# Patient Record
Sex: Female | Born: 1970 | Race: White | Hispanic: No | Marital: Married | Smoking: Never smoker
Health system: Southern US, Community
[De-identification: ages and names within clinical notes are randomized; demographics above are authoritative.]

## PROBLEM LIST (undated history)

## (undated) DIAGNOSIS — K589 Irritable bowel syndrome without diarrhea: Secondary | ICD-10-CM

## (undated) DIAGNOSIS — K219 Gastro-esophageal reflux disease without esophagitis: Secondary | ICD-10-CM

## (undated) DIAGNOSIS — J342 Deviated nasal septum: Secondary | ICD-10-CM

## (undated) DIAGNOSIS — N870 Mild cervical dysplasia: Secondary | ICD-10-CM

## (undated) DIAGNOSIS — J302 Other seasonal allergic rhinitis: Secondary | ICD-10-CM

## (undated) DIAGNOSIS — G43909 Migraine, unspecified, not intractable, without status migrainosus: Secondary | ICD-10-CM

## (undated) DIAGNOSIS — N83209 Unspecified ovarian cyst, unspecified side: Secondary | ICD-10-CM

## (undated) HISTORY — DX: Irritable bowel syndrome, unspecified: K58.9

## (undated) HISTORY — PX: NO PAST SURGERIES: SHX2092

## (undated) HISTORY — DX: Gastro-esophageal reflux disease without esophagitis: K21.9

## (undated) HISTORY — DX: Other seasonal allergic rhinitis: J30.2

## (undated) HISTORY — DX: Migraine, unspecified, not intractable, without status migrainosus: G43.909

## (undated) HISTORY — DX: Mild cervical dysplasia: N87.0

## (undated) HISTORY — DX: Unspecified ovarian cyst, unspecified side: N83.209

## (undated) HISTORY — DX: Deviated nasal septum: J34.2

---

## 2005-06-24 ENCOUNTER — Other Ambulatory Visit: Admission: RE | Admit: 2005-06-24 | Discharge: 2005-06-24 | Payer: Self-pay | Admitting: Gynecology

## 2005-09-07 ENCOUNTER — Other Ambulatory Visit: Admission: RE | Admit: 2005-09-07 | Discharge: 2005-09-07 | Payer: Self-pay | Admitting: Gynecology

## 2006-03-13 ENCOUNTER — Inpatient Hospital Stay (HOSPITAL_COMMUNITY): Admission: AD | Admit: 2006-03-13 | Discharge: 2006-03-14 | Payer: Self-pay | Admitting: Gynecology

## 2006-04-26 ENCOUNTER — Other Ambulatory Visit: Admission: RE | Admit: 2006-04-26 | Discharge: 2006-04-26 | Payer: Self-pay | Admitting: Gynecology

## 2007-04-28 ENCOUNTER — Other Ambulatory Visit: Admission: RE | Admit: 2007-04-28 | Discharge: 2007-04-28 | Payer: Self-pay | Admitting: Gynecology

## 2008-05-17 ENCOUNTER — Other Ambulatory Visit: Admission: RE | Admit: 2008-05-17 | Discharge: 2008-05-17 | Payer: Self-pay | Admitting: Gynecology

## 2008-06-28 ENCOUNTER — Ambulatory Visit (HOSPITAL_COMMUNITY): Admission: RE | Admit: 2008-06-28 | Discharge: 2008-06-28 | Payer: Self-pay | Admitting: Gynecology

## 2009-01-28 ENCOUNTER — Ambulatory Visit: Payer: Self-pay | Admitting: Obstetrics and Gynecology

## 2009-02-03 ENCOUNTER — Ambulatory Visit: Payer: Self-pay | Admitting: Obstetrics and Gynecology

## 2009-02-07 ENCOUNTER — Ambulatory Visit: Payer: Self-pay | Admitting: Obstetrics and Gynecology

## 2009-02-12 ENCOUNTER — Ambulatory Visit: Payer: Self-pay | Admitting: Obstetrics and Gynecology

## 2009-06-23 ENCOUNTER — Encounter: Payer: Self-pay | Admitting: Obstetrics and Gynecology

## 2009-06-23 ENCOUNTER — Other Ambulatory Visit: Admission: RE | Admit: 2009-06-23 | Discharge: 2009-06-23 | Payer: Self-pay | Admitting: Obstetrics and Gynecology

## 2009-06-23 ENCOUNTER — Ambulatory Visit: Payer: Self-pay | Admitting: Obstetrics and Gynecology

## 2010-06-16 ENCOUNTER — Ambulatory Visit: Payer: Self-pay | Admitting: Women's Health

## 2010-06-25 ENCOUNTER — Other Ambulatory Visit: Admission: RE | Admit: 2010-06-25 | Discharge: 2010-06-25 | Payer: Self-pay | Admitting: Obstetrics and Gynecology

## 2010-06-25 ENCOUNTER — Ambulatory Visit: Payer: Self-pay | Admitting: Obstetrics and Gynecology

## 2011-04-19 ENCOUNTER — Other Ambulatory Visit: Payer: Self-pay | Admitting: Obstetrics and Gynecology

## 2011-04-19 DIAGNOSIS — Z1231 Encounter for screening mammogram for malignant neoplasm of breast: Secondary | ICD-10-CM

## 2011-05-03 ENCOUNTER — Ambulatory Visit (HOSPITAL_COMMUNITY)
Admission: RE | Admit: 2011-05-03 | Discharge: 2011-05-03 | Disposition: A | Discharge status: Home / Self Care | Payer: BC Managed Care – PPO | Source: Ambulatory Visit | Attending: Obstetrics and Gynecology | Admitting: Obstetrics and Gynecology

## 2011-05-03 DIAGNOSIS — Z1231 Encounter for screening mammogram for malignant neoplasm of breast: Secondary | ICD-10-CM

## 2011-06-30 ENCOUNTER — Other Ambulatory Visit (HOSPITAL_COMMUNITY)
Admission: RE | Admit: 2011-06-30 | Discharge: 2011-06-30 | Disposition: A | Discharge status: Home / Self Care | Payer: BC Managed Care – PPO | Source: Ambulatory Visit | Attending: Obstetrics and Gynecology | Admitting: Obstetrics and Gynecology

## 2011-06-30 ENCOUNTER — Other Ambulatory Visit: Payer: Self-pay | Admitting: Obstetrics and Gynecology

## 2011-06-30 ENCOUNTER — Encounter (INDEPENDENT_AMBULATORY_CARE_PROVIDER_SITE_OTHER): Payer: BC Managed Care – PPO | Admitting: Obstetrics and Gynecology

## 2011-06-30 DIAGNOSIS — Z01419 Encounter for gynecological examination (general) (routine) without abnormal findings: Secondary | ICD-10-CM

## 2011-06-30 DIAGNOSIS — Z13220 Encounter for screening for lipoid disorders: Secondary | ICD-10-CM

## 2011-06-30 DIAGNOSIS — Z124 Encounter for screening for malignant neoplasm of cervix: Secondary | ICD-10-CM | POA: Insufficient documentation

## 2012-04-17 ENCOUNTER — Other Ambulatory Visit (INDEPENDENT_AMBULATORY_CARE_PROVIDER_SITE_OTHER): Payer: Self-pay | Admitting: Otolaryngology

## 2012-04-17 DIAGNOSIS — J320 Chronic maxillary sinusitis: Secondary | ICD-10-CM

## 2012-04-19 ENCOUNTER — Ambulatory Visit
Admission: RE | Admit: 2012-04-19 | Discharge: 2012-04-19 | Disposition: A | Discharge status: Home / Self Care | Payer: BC Managed Care – PPO | Source: Ambulatory Visit | Attending: Otolaryngology | Admitting: Otolaryngology

## 2012-04-19 DIAGNOSIS — J320 Chronic maxillary sinusitis: Secondary | ICD-10-CM

## 2012-07-27 ENCOUNTER — Encounter: Payer: Self-pay | Admitting: Gynecology

## 2012-07-27 DIAGNOSIS — K589 Irritable bowel syndrome without diarrhea: Secondary | ICD-10-CM | POA: Insufficient documentation

## 2012-08-08 ENCOUNTER — Ambulatory Visit (INDEPENDENT_AMBULATORY_CARE_PROVIDER_SITE_OTHER): Payer: BC Managed Care – PPO | Admitting: Obstetrics and Gynecology

## 2012-08-08 ENCOUNTER — Encounter: Payer: Self-pay | Admitting: Obstetrics and Gynecology

## 2012-08-08 VITALS — BP 120/74 | Ht 64.0 in | Wt 120.0 lb

## 2012-08-08 DIAGNOSIS — G43909 Migraine, unspecified, not intractable, without status migrainosus: Secondary | ICD-10-CM | POA: Insufficient documentation

## 2012-08-08 DIAGNOSIS — N870 Mild cervical dysplasia: Secondary | ICD-10-CM | POA: Insufficient documentation

## 2012-08-08 DIAGNOSIS — Z01419 Encounter for gynecological examination (general) (routine) without abnormal findings: Secondary | ICD-10-CM

## 2012-08-08 DIAGNOSIS — N83209 Unspecified ovarian cyst, unspecified side: Secondary | ICD-10-CM | POA: Insufficient documentation

## 2012-08-08 DIAGNOSIS — J342 Deviated nasal septum: Secondary | ICD-10-CM | POA: Insufficient documentation

## 2012-08-08 LAB — CBC WITH DIFFERENTIAL/PLATELET
Lymphocytes Relative: 25 % (ref 12–46)
Lymphs Abs: 2.2 10*3/uL (ref 0.7–4.0)
MCV: 88.3 fL (ref 78.0–100.0)
Neutrophils Relative %: 65 % (ref 43–77)
Platelets: 340 10*3/uL (ref 150–400)
RBC: 4.63 MIL/uL (ref 3.87–5.11)
WBC: 8.5 10*3/uL (ref 4.0–10.5)

## 2012-08-08 NOTE — Patient Instructions (Signed)
Schedule mammogram.

## 2012-08-08 NOTE — Progress Notes (Signed)
Patient came to see me today for her annual GYN exam. Her husband has had a vasectomy. She has regular cycles. The first several days are heavy. She also gets significant dysmenorrhea that she takes ibuprofen for. We have previously discussed endometrial ablation but she does not feel it is significant enough to proceed yet. She had a normal mammogram last year. She is now due. She had a normal lipid profile last year. In 2006 she had a Pap smear showing CIN-1. She was scheduled for colposcopy but conceived in the interim. Colposcopy was not done. She had a normal Pap smear during her pregnancy. She has now had a total of 7 normal Pap smears since 2006 including a normal one in 2012. She has no other GYN problems.  Physical examination:Kim Julian Reil present. HEENT within normal limits. Neck: Thyroid not large. No masses. Supraclavicular nodes: not enlarged. Breasts: Examined in both sitting and lying  position. No skin changes and no masses. Abdomen: Soft no guarding rebound or masses or hernia. Pelvic: External: Within normal limits. BUS: Within normal limits. Vaginal:within normal limits. Good estrogen effect. No evidence of cystocele rectocele or enterocele. Cervix: clean. Uterus: Normal size and shape. Adnexa: No masses. Rectovaginal exam: Confirmatory and negative. Extremities: Within normal limits.  Assessment: #1. Dysmenorrhea and menorrhagia #2. CIN-1 in 2006  Plan: We  rediscussed endometrial ablation. She will let me know when necessary. She will schedule a mammogram.The new Pap smear guidelines were discussed with the patient. No pap  done.

## 2012-08-09 LAB — URINALYSIS W MICROSCOPIC + REFLEX CULTURE
Glucose, UA: NEGATIVE mg/dL
Leukocytes, UA: NEGATIVE
Protein, ur: NEGATIVE mg/dL
Urobilinogen, UA: 0.2 mg/dL (ref 0.0–1.0)

## 2013-06-18 ENCOUNTER — Other Ambulatory Visit: Payer: Self-pay | Admitting: Women's Health

## 2013-06-18 DIAGNOSIS — Z1231 Encounter for screening mammogram for malignant neoplasm of breast: Secondary | ICD-10-CM

## 2013-06-21 ENCOUNTER — Ambulatory Visit (HOSPITAL_COMMUNITY)
Admission: RE | Admit: 2013-06-21 | Discharge: 2013-06-21 | Disposition: A | Discharge status: Home / Self Care | Payer: 59 | Source: Ambulatory Visit | Attending: Women's Health | Admitting: Women's Health

## 2013-06-21 DIAGNOSIS — Z1231 Encounter for screening mammogram for malignant neoplasm of breast: Secondary | ICD-10-CM | POA: Insufficient documentation

## 2013-08-10 ENCOUNTER — Ambulatory Visit (INDEPENDENT_AMBULATORY_CARE_PROVIDER_SITE_OTHER): Payer: 59 | Admitting: Women's Health

## 2013-08-10 ENCOUNTER — Encounter: Payer: Self-pay | Admitting: Women's Health

## 2013-08-10 ENCOUNTER — Other Ambulatory Visit (HOSPITAL_COMMUNITY)
Admission: RE | Admit: 2013-08-10 | Discharge: 2013-08-10 | Disposition: A | Discharge status: Home / Self Care | Payer: 59 | Source: Ambulatory Visit | Attending: Gynecology | Admitting: Gynecology

## 2013-08-10 VITALS — BP 104/64 | Ht 64.0 in | Wt 121.0 lb

## 2013-08-10 DIAGNOSIS — Z01419 Encounter for gynecological examination (general) (routine) without abnormal findings: Secondary | ICD-10-CM

## 2013-08-10 LAB — CBC WITH DIFFERENTIAL/PLATELET
HCT: 40.5 % (ref 36.0–46.0)
Hemoglobin: 14.2 g/dL (ref 12.0–15.0)
Lymphocytes Relative: 19 % (ref 12–46)
Monocytes Absolute: 0.6 10*3/uL (ref 0.1–1.0)
Monocytes Relative: 8 % (ref 3–12)
Neutro Abs: 5.5 10*3/uL (ref 1.7–7.7)
WBC: 7.6 10*3/uL (ref 4.0–10.5)

## 2013-08-10 NOTE — Progress Notes (Signed)
Christina Hicks Sep 01, 1971 [id number]    History:    The patient presents for annual exam.  Monthly cycles/vasectomy. CIN-1 2006 with normal Paps after. Normal mammogram history breast noted to be dense. IBS well controlled with diet and exercise.   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 Father heart disease.   ROS:  A  ROS was performed and pertinent positives and negatives are included in the history.  Exam:  Filed Vitals:   08/10/13 0806  BP: 104/64    General appearance:  Normal Head/Neck:  Normal, without cervical or supraclavicular adenopathy. Thyroid:  Symmetrical, normal in size, without palpable masses or nodularity. Respiratory  Effort:  Normal  Auscultation:  Clear without wheezing or rhonchi Cardiovascular  Auscultation:  Regular rate, without rubs, murmurs or gallops  Edema/varicosities:  Not grossly evident Abdominal  Soft,nontender, without masses, guarding or rebound.  Liver/spleen:  No organomegaly noted  Hernia:  None appreciated  Skin  Inspection:  Grossly normal  Palpation:  Grossly normal Neurologic/psychiatric  Orientation:  Normal with appropriate conversation.  Mood/affect:  Normal  Genitourinary    Breasts: Examined lying and sitting.     Right: Without masses, retractions, discharge or axillary adenopathy.     Left: Without masses, retractions, discharge or axillary adenopathy.   Inguinal/mons:  Normal without inguinal adenopathy  External genitalia:  Normal  BUS/Urethra/Skene's glands:  Normal  Bladder:  Normal  Vagina:  Normal  Cervix:  Normal  Uterus:  normal in size, shape and contour.  Midline and mobile  Adnexa/parametria:     Rt: Without masses or tenderness.   Lt: Without masses or tenderness.  Anus and perineum: Normal  Digital rectal exam: Normal sphincter tone without  palpated masses or tenderness  Assessment/Plan:  42 y.o. M WF G3 P2 for annual exam.     CIN-1 2006 normal Paps after Vasectomy   Plan: SBE's, continue annual mammogram, 3-D tomography reviewed encouraged to 2 density of breast. Continue regular exercise, calcium rich diet, vitamin D 1000 daily encouraged. CBC, UA, Pap. 5 stitches removed from left great toe, well approximated, nonerythematous, injury one week ago, received a tetanus shot.  Harrington Challenger [facility], 8:29 AM 08/10/2013

## 2013-08-10 NOTE — Patient Instructions (Signed)

## 2013-08-10 NOTE — Addendum Note (Signed)
Addended by: Richardson Chiquito on: 08/10/2013 12:25 PM   Modules accepted: Orders

## 2013-08-11 LAB — URINALYSIS W MICROSCOPIC + REFLEX CULTURE
Bilirubin Urine: NEGATIVE
Crystals: NONE SEEN
Glucose, UA: NEGATIVE mg/dL
Specific Gravity, Urine: 1.009 (ref 1.005–1.030)
Squamous Epithelial / LPF: NONE SEEN
Urobilinogen, UA: 0.2 mg/dL (ref 0.0–1.0)

## 2013-08-14 ENCOUNTER — Encounter: Payer: Self-pay | Admitting: Obstetrics and Gynecology

## 2014-08-13 ENCOUNTER — Encounter: Payer: Self-pay | Admitting: Women's Health

## 2014-08-13 ENCOUNTER — Ambulatory Visit (INDEPENDENT_AMBULATORY_CARE_PROVIDER_SITE_OTHER): Payer: 59 | Admitting: Women's Health

## 2014-08-13 VITALS — BP 120/78 | Ht 64.5 in | Wt 122.0 lb

## 2014-08-13 DIAGNOSIS — Z13220 Encounter for screening for lipoid disorders: Secondary | ICD-10-CM

## 2014-08-13 DIAGNOSIS — E079 Disorder of thyroid, unspecified: Secondary | ICD-10-CM

## 2014-08-13 DIAGNOSIS — Z01419 Encounter for gynecological examination (general) (routine) without abnormal findings: Secondary | ICD-10-CM

## 2014-08-13 LAB — CBC WITH DIFFERENTIAL/PLATELET
BASOS PCT: 1 % (ref 0–1)
Basophils Absolute: 0.1 10*3/uL (ref 0.0–0.1)
EOS ABS: 0.1 10*3/uL (ref 0.0–0.7)
Eosinophils Relative: 1 % (ref 0–5)
HCT: 38.2 % (ref 36.0–46.0)
HEMOGLOBIN: 13 g/dL (ref 12.0–15.0)
Lymphocytes Relative: 36 % (ref 12–46)
Lymphs Abs: 3.4 10*3/uL (ref 0.7–4.0)
MCH: 30.5 pg (ref 26.0–34.0)
MCHC: 34 g/dL (ref 30.0–36.0)
MCV: 89.7 fL (ref 78.0–100.0)
Monocytes Absolute: 0.7 10*3/uL (ref 0.1–1.0)
Monocytes Relative: 7 % (ref 3–12)
NEUTROS ABS: 5.2 10*3/uL (ref 1.7–7.7)
NEUTROS PCT: 55 % (ref 43–77)
PLATELETS: 371 10*3/uL (ref 150–400)
RBC: 4.26 MIL/uL (ref 3.87–5.11)
RDW: 14.4 % (ref 11.5–15.5)
WBC: 9.5 10*3/uL (ref 4.0–10.5)

## 2014-08-13 LAB — LIPID PANEL
CHOL/HDL RATIO: 2.6 ratio
CHOLESTEROL: 151 mg/dL (ref 0–200)
HDL: 57 mg/dL (ref >39)
LDL Cholesterol: 81 mg/dL (ref 0–99)
TRIGLYCERIDES: 65 mg/dL (ref <150)
VLDL: 13 mg/dL (ref 0–40)

## 2014-08-13 NOTE — Progress Notes (Signed)
Christina Hicks September 10, 1971 [id number]    History:    Presents for annual exam.  Regular monthly 5 daycycle with menorrhagia and dysmenorrhea/vasectomy. Denies bleeding between cycles. 2006 LGSIL1 with normal Paps after. IBS. Normal mammogram history.  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  ROS:  A  12 point ROS was performed and pertinent positives and negatives are included.  Exam:  Filed Vitals:   08/13/14 1431  BP: 120/78    General appearance:  Normal Thyroid:  Symmetrical, normal in size, without palpable masses or nodularity. Respiratory  Auscultation:  Clear without wheezing or rhonchi Cardiovascular  Auscultation:  Regular rate, without rubs, murmurs or gallops  Edema/varicosities:  Not grossly evident Abdominal  Soft,nontender, without masses, guarding or rebound.  Liver/spleen:  No organomegaly noted  Hernia:  None appreciated  Skin  Inspection:  Grossly normal   Breasts: Examined lying and sitting.     Right: Without masses, retractions, discharge or axillary adenopathy.     Left: Without masses, retractions, discharge or axillary adenopathy. Gentitourinary   Inguinal/mons:  Normal without inguinal adenopathy  External genitalia:  Normal  BUS/Urethra/Skene's glands:  Normal  Vagina:  Normal  Cervix:  Normal  Uterus:   normal in size, shape and contour.  Midline and mobile  Adnexa/parametria:     Rt: Without masses or tenderness.   Lt: Without masses or tenderness.  Anus and perineum: Normal  Digital rectal exam: Normal sphincter tone without palpated masses or tenderness  Assessment/Plan:  43 y.o. MWF G2P2 for annual exam.    Monthly cycle with menorrhagia and dysmenorrhea/vasectomy 2006 CIN-1 with normal Paps after  Plan: Options reviewed, will try Motrin OTC with cycles.  Instructed to call if cycles become more difficult to manage. SBE's, annual mammogram, 3-D tomography reviewed and encouraged history of dense breasts. Encouraged regular exercise, calcium rich diet, vitamin D 1000 daily. CBC, TSH, lipid panel, UA, Pap normal 2014, new screening guidelines reviewed.   Note: This dictation was prepared with Dragon/digital dictation.  Any transcriptional errors that result are unintentional. Harrington Challenger [facility], 3:52 PM 08/13/2014

## 2014-08-13 NOTE — Patient Instructions (Signed)

## 2014-08-14 LAB — URINALYSIS W MICROSCOPIC + REFLEX CULTURE
BACTERIA UA: NONE SEEN
Bilirubin Urine: NEGATIVE
Casts: NONE SEEN
Crystals: NONE SEEN
Glucose, UA: NEGATIVE mg/dL
KETONES UR: NEGATIVE mg/dL
LEUKOCYTES UA: NEGATIVE
NITRITE: NEGATIVE
PROTEIN: NEGATIVE mg/dL
Specific Gravity, Urine: 1.015 (ref 1.005–1.030)
Squamous Epithelial / LPF: NONE SEEN
UROBILINOGEN UA: 0.2 mg/dL (ref 0.0–1.0)
pH: 6 (ref 5.0–8.0)

## 2014-08-14 LAB — TSH: TSH: 1.955 u[IU]/mL (ref 0.350–4.500)

## 2014-08-16 LAB — URINE CULTURE: Colony Count: 50000

## 2014-10-14 ENCOUNTER — Encounter: Payer: Self-pay | Admitting: Women's Health

## 2015-05-07 ENCOUNTER — Other Ambulatory Visit: Payer: Self-pay | Admitting: Women's Health

## 2015-05-07 DIAGNOSIS — Z1231 Encounter for screening mammogram for malignant neoplasm of breast: Secondary | ICD-10-CM

## 2015-05-21 ENCOUNTER — Ambulatory Visit (HOSPITAL_COMMUNITY)
Admission: RE | Admit: 2015-05-21 | Discharge: 2015-05-21 | Disposition: A | Discharge status: Home / Self Care | Payer: 59 | Source: Ambulatory Visit | Attending: Women's Health | Admitting: Women's Health

## 2015-05-21 DIAGNOSIS — Z1231 Encounter for screening mammogram for malignant neoplasm of breast: Secondary | ICD-10-CM

## 2015-09-18 ENCOUNTER — Encounter: Payer: Self-pay | Admitting: Women's Health

## 2015-09-18 ENCOUNTER — Other Ambulatory Visit (HOSPITAL_COMMUNITY)
Admission: RE | Admit: 2015-09-18 | Discharge: 2015-09-18 | Disposition: A | Discharge status: Home / Self Care | Payer: 59 | Source: Ambulatory Visit | Attending: Women's Health | Admitting: Women's Health

## 2015-09-18 ENCOUNTER — Ambulatory Visit (INDEPENDENT_AMBULATORY_CARE_PROVIDER_SITE_OTHER): Payer: 59 | Admitting: Women's Health

## 2015-09-18 VITALS — BP 120/76 | Ht 64.0 in | Wt 124.0 lb

## 2015-09-18 DIAGNOSIS — Z23 Encounter for immunization: Secondary | ICD-10-CM | POA: Diagnosis not present

## 2015-09-18 DIAGNOSIS — Z01419 Encounter for gynecological examination (general) (routine) without abnormal findings: Secondary | ICD-10-CM | POA: Diagnosis not present

## 2015-09-18 DIAGNOSIS — Z1151 Encounter for screening for human papillomavirus (HPV): Secondary | ICD-10-CM | POA: Insufficient documentation

## 2015-09-18 LAB — CBC WITH DIFFERENTIAL/PLATELET
BASOS PCT: 1 % (ref 0–1)
Basophils Absolute: 0.1 10*3/uL (ref 0.0–0.1)
EOS ABS: 0.1 10*3/uL (ref 0.0–0.7)
Eosinophils Relative: 1 % (ref 0–5)
HCT: 39.1 % (ref 36.0–46.0)
HEMOGLOBIN: 13.7 g/dL (ref 12.0–15.0)
Lymphocytes Relative: 27 % (ref 12–46)
Lymphs Abs: 2.2 10*3/uL (ref 0.7–4.0)
MCH: 31.6 pg (ref 26.0–34.0)
MCHC: 35 g/dL (ref 30.0–36.0)
MCV: 90.3 fL (ref 78.0–100.0)
MONO ABS: 0.5 10*3/uL (ref 0.1–1.0)
MPV: 9.6 fL (ref 8.6–12.4)
Monocytes Relative: 6 % (ref 3–12)
NEUTROS ABS: 5.3 10*3/uL (ref 1.7–7.7)
NEUTROS PCT: 65 % (ref 43–77)
PLATELETS: 343 10*3/uL (ref 150–400)
RBC: 4.33 MIL/uL (ref 3.87–5.11)
RDW: 13 % (ref 11.5–15.5)
WBC: 8.2 10*3/uL (ref 4.0–10.5)

## 2015-09-18 LAB — COMPREHENSIVE METABOLIC PANEL
ALBUMIN: 4.3 g/dL (ref 3.6–5.1)
ALT: 15 U/L (ref 6–29)
AST: 22 U/L (ref 10–30)
Alkaline Phosphatase: 46 U/L (ref 33–115)
BILIRUBIN TOTAL: 0.5 mg/dL (ref 0.2–1.2)
BUN: 14 mg/dL (ref 7–25)
CHLORIDE: 102 mmol/L (ref 98–110)
CO2: 27 mmol/L (ref 20–31)
CREATININE: 0.59 mg/dL (ref 0.50–1.10)
Calcium: 9.2 mg/dL (ref 8.6–10.2)
GLUCOSE: 84 mg/dL (ref 65–99)
Potassium: 4 mmol/L (ref 3.5–5.3)
SODIUM: 136 mmol/L (ref 135–146)
Total Protein: 7.3 g/dL (ref 6.1–8.1)

## 2015-09-18 LAB — LIPID PANEL
Cholesterol: 163 mg/dL (ref 125–200)
HDL: 53 mg/dL (ref >=46)
LDL CALC: 99 mg/dL (ref <130)
TRIGLYCERIDES: 55 mg/dL (ref <150)
Total CHOL/HDL Ratio: 3.1 Ratio (ref <=5.0)
VLDL: 11 mg/dL (ref <30)

## 2015-09-18 NOTE — Progress Notes (Signed)
Christina Hicks 19-Jan-1971 [id number] Hicks  History:    Presents for annual exam.  Regular monthly 5 day cycles/vasectomy. 2006 LGSIL with normal Paps after. IBS. Normal mammogram history. Has had some left breast tenderness in the past few weeks that is resolving.   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  ROS:  A ROS was performed and pertinent positives and negatives are included.  Exam:  Filed Vitals:   09/18/15 1058  BP: 120/76    General appearance:  Normal Thyroid:  Symmetrical, normal in size, without palpable masses or nodularity. Respiratory  Auscultation:  Clear without wheezing or rhonchi Cardiovascular  Auscultation:  Regular rate, without rubs, murmurs or gallops  Edema/varicosities:  Not grossly evident Abdominal  Soft,nontender, without masses, guarding or rebound.  Liver/spleen:  No organomegaly noted  Hernia:  None appreciated  Skin  Inspection:  Grossly normal   Breasts: Examined lying and sitting.     Right: Without masses, retractions, discharge or axillary adenopathy.     Left: Without masses, retractions, discharge or axillary adenopathy, no tenderness with exam. Gentitourinary   Inguinal/mons:  Normal without inguinal adenopathy  External genitalia:  Normal  BUS/Urethra/Skene's glands:  Normal  Vagina:  Normal  Cervix:  Normal  Uterus:   normal in size, shape and contour.  Midline and mobile  Adnexa/parametria:     Rt: Without masses or tenderness.   Lt: Without masses or tenderness.  Anus and perineum: Normal  Digital rectal exam: Normal sphincter tone without palpated masses or tenderness  Assessment/Plan:  44 y.o. MWF G2 P2 for annual exam.     Monthly cycle/vasectomy 2006 LGSIL with normal Paps after IBS Left breast tenderness resolving  Plan: SBE's, report any changes, call if  tenderness persists. Reviewed normality and nontender with exam.  Had a normal 3-D mammogram 05/2015. Continue 3-D history of dense breasts. Continue regular exercise, calcium rich diet, vitamin D 1000 daily encouraged. CBC, lipid panel, vitamin D, CMP, UA, Pap with HR HPV typing, new screening guidelines reviewed.  Flu shot given.  Harrington Challenger [facility], 11:56 AM 09/18/2015

## 2015-09-18 NOTE — Addendum Note (Signed)
Addended by: Dayna Barker on: 09/18/2015 12:13 PM   Modules accepted: Orders

## 2015-09-18 NOTE — Patient Instructions (Signed)
Health Maintenance, Female Adopting a healthy lifestyle and getting preventive care can go a long way to promote health and wellness. Talk with your health care provider about what schedule of regular examinations is right for you. This is a good chance for you to check in with your provider about disease prevention and staying healthy. In between checkups, there are plenty of things you can do on your own. Experts have done a lot of research about which lifestyle changes and preventive measures are most likely to keep you healthy. Ask your health care provider for more information. WEIGHT AND DIET  Eat a healthy diet  Be sure to include plenty of vegetables, fruits, low-fat dairy products, and lean protein.  Do not eat a lot of foods high in solid fats, added sugars, or salt.  Get regular exercise. This is one of the most important things you can do for your health.  Most adults should exercise for at least 150 minutes each week. The exercise should increase your heart rate and make you sweat (moderate-intensity exercise).  Most adults should also do strengthening exercises at least twice a week. This is in addition to the moderate-intensity exercise.  Maintain a healthy weight  Body mass index (BMI) is a measurement that can be used to identify possible weight problems. It estimates body fat based on height and weight. Your health care provider can help determine your BMI and help you achieve or maintain a healthy weight.  For females 20 years of age and older:   A BMI below 18.5 is considered underweight.  A BMI of 18.5 to 24.9 is normal.  A BMI of 25 to 29.9 is considered overweight.  A BMI of 30 and above is considered obese.  Watch levels of cholesterol and blood lipids  You should start having your blood tested for lipids and cholesterol at 44 years of age, then have this test every 5 years.  You may need to have your cholesterol levels checked more often if:  Your lipid  or cholesterol levels are high.  You are older than 44 years of age.  You are at high risk for heart disease.  CANCER SCREENING   Lung Cancer  Lung cancer screening is recommended for adults 55-80 years old who are at high risk for lung cancer because of a history of smoking.  A yearly low-dose CT scan of the lungs is recommended for people who:  Currently smoke.  Have quit within the past 15 years.  Have at least a 30-pack-year history of smoking. A pack year is smoking an average of one pack of cigarettes a day for 1 year.  Yearly screening should continue until it has been 15 years since you quit.  Yearly screening should stop if you develop a health problem that would prevent you from having lung cancer treatment.  Breast Cancer  Practice breast self-awareness. This means understanding how your breasts normally appear and feel.  It also means doing regular breast self-exams. Let your health care provider know about any changes, no matter how small.  If you are in your 20s or 30s, you should have a clinical breast exam (CBE) by a health care provider every 1-3 years as part of a regular health exam.  If you are 40 or older, have a CBE every year. Also consider having a breast X-ray (mammogram) every year.  If you have a family history of breast cancer, talk to your health care provider about genetic screening.  If you   are at high risk for breast cancer, talk to your health care provider about having an MRI and a mammogram every year.  Breast cancer gene (BRCA) assessment is recommended for women who have family members with BRCA-related cancers. BRCA-related cancers include:  Breast.  Ovarian.  Tubal.  Peritoneal cancers.  Results of the assessment will determine the need for genetic counseling and BRCA1 and BRCA2 testing. Cervical Cancer Your health care provider may recommend that you be screened regularly for cancer of the pelvic organs (ovaries, uterus, and  vagina). This screening involves a pelvic examination, including checking for microscopic changes to the surface of your cervix (Pap test). You may be encouraged to have this screening done every 3 years, beginning at age 21.  For women ages 30-65, health care providers may recommend pelvic exams and Pap testing every 3 years, or they may recommend the Pap and pelvic exam, combined with testing for human papilloma virus (HPV), every 5 years. Some types of HPV increase your risk of cervical cancer. Testing for HPV may also be done on women of any age with unclear Pap test results.  Other health care providers may not recommend any screening for nonpregnant women who are considered low risk for pelvic cancer and who do not have symptoms. Ask your health care provider if a screening pelvic exam is right for you.  If you have had past treatment for cervical cancer or a condition that could lead to cancer, you need Pap tests and screening for cancer for at least 20 years after your treatment. If Pap tests have been discontinued, your risk factors (such as having a new sexual partner) need to be reassessed to determine if screening should resume. Some women have medical problems that increase the chance of getting cervical cancer. In these cases, your health care provider may recommend more frequent screening and Pap tests. Colorectal Cancer  This type of cancer can be detected and often prevented.  Routine colorectal cancer screening usually begins at 44 years of age and continues through 44 years of age.  Your health care provider may recommend screening at an earlier age if you have risk factors for colon cancer.  Your health care provider may also recommend using home test kits to check for hidden blood in the stool.  A small camera at the end of a tube can be used to examine your colon directly (sigmoidoscopy or colonoscopy). This is done to check for the earliest forms of colorectal  cancer.  Routine screening usually begins at age 50.  Direct examination of the colon should be repeated every 5-10 years through 44 years of age. However, you may need to be screened more often if early forms of precancerous polyps or small growths are found. Skin Cancer  Check your skin from head to toe regularly.  Tell your health care provider about any new moles or changes in moles, especially if there is a change in a mole's shape or color.  Also tell your health care provider if you have a mole that is larger than the size of a pencil eraser.  Always use sunscreen. Apply sunscreen liberally and repeatedly throughout the day.  Protect yourself by wearing long sleeves, pants, a wide-brimmed hat, and sunglasses whenever you are outside. HEART DISEASE, DIABETES, AND HIGH BLOOD PRESSURE   High blood pressure causes heart disease and increases the risk of stroke. High blood pressure is more likely to develop in:  People who have blood pressure in the high end   of the normal range (130-139/85-89 mm Hg).  People who are overweight or obese.  People who are African American.  If you are 38-23 years of age, have your blood pressure checked every 3-5 years. If you are 61 years of age or older, have your blood pressure checked every year. You should have your blood pressure measured twice--once when you are at a hospital or clinic, and once when you are not at a hospital or clinic. Record the average of the two measurements. To check your blood pressure when you are not at a hospital or clinic, you can use:  An automated blood pressure machine at a pharmacy.  A home blood pressure monitor.  If you are between 45 years and 39 years old, ask your health care provider if you should take aspirin to prevent strokes.  Have regular diabetes screenings. This involves taking a blood sample to check your fasting blood sugar level.  If you are at a normal weight and have a low risk for diabetes,  have this test once every three years after 44 years of age.  If you are overweight and have a high risk for diabetes, consider being tested at a younger age or more often. PREVENTING INFECTION  Hepatitis B  If you have a higher risk for hepatitis B, you should be screened for this virus. You are considered at high risk for hepatitis B if:  You were born in a country where hepatitis B is common. Ask your health care provider which countries are considered high risk.  Your parents were born in a high-risk country, and you have not been immunized against hepatitis B (hepatitis B vaccine).  You have HIV or AIDS.  You use needles to inject street drugs.  You live with someone who has hepatitis B.  You have had sex with someone who has hepatitis B.  You get hemodialysis treatment.  You take certain medicines for conditions, including cancer, organ transplantation, and autoimmune conditions. Hepatitis C  Blood testing is recommended for:  Everyone born from [birth year] through 1965.  Anyone with known risk factors for hepatitis C. Sexually transmitted infections (STIs)  You should be screened for sexually transmitted infections (STIs) including gonorrhea and chlamydia if:  You are sexually active and are younger than 44 years of age.  You are older than 44 years of age and your health care provider tells you that you are at risk for this type of infection.  Your sexual activity has changed since you were last screened and you are at an increased risk for chlamydia or gonorrhea. Ask your health care provider if you are at risk.  If you do not have HIV, but are at risk, it may be recommended that you take a prescription medicine daily to prevent HIV infection. This is called pre-exposure prophylaxis (PrEP). You are considered at risk if:  You are sexually active and do not regularly use condoms or know the HIV status of your partner(s).  You take drugs by injection.  You are sexually  active with a partner who has HIV. Talk with your health care provider about whether you are at high risk of being infected with HIV. If you choose to begin PrEP, you should first be tested for HIV. You should then be tested every 3 months for as long as you are taking PrEP.  PREGNANCY   If you are premenopausal and you may become pregnant, ask your health care provider about preconception counseling.  If you may  become pregnant, take 400 to 800 micrograms (mcg) of folic acid every day.  If you want to prevent pregnancy, talk to your health care provider about birth control (contraception). OSTEOPOROSIS AND MENOPAUSE   Osteoporosis is a disease in which the bones lose minerals and strength with aging. This can result in serious bone fractures. Your risk for osteoporosis can be identified using a bone density scan.  If you are 61 years of age or older, or if you are at risk for osteoporosis and fractures, ask your health care provider if you should be screened.  Ask your health care provider whether you should take a calcium or vitamin D supplement to lower your risk for osteoporosis.  Menopause may have certain physical symptoms and risks.  Hormone replacement therapy may reduce some of these symptoms and risks. Talk to your health care provider about whether hormone replacement therapy is right for you.  HOME CARE INSTRUCTIONS   Schedule regular health, dental, and eye exams.  Stay current with your immunizations.   Do not use any tobacco products including cigarettes, chewing tobacco, or electronic cigarettes.  If you are pregnant, do not drink alcohol.  If you are breastfeeding, limit how much and how often you drink alcohol.  Limit alcohol intake to no more than 1 drink per day for nonpregnant women. One drink equals 12 ounces of beer, 5 ounces of wine, or 1 ounces of hard liquor.  Do not use street drugs.  Do not share needles.  Ask your health care provider for help if  you need support or information about quitting drugs.  Tell your health care provider if you often feel depressed.  Tell your health care provider if you have ever been abused or do not feel safe at home.   This information is not intended to replace advice given to you by your health care provider. Make sure you discuss any questions you have with your health care provider.   Document Released: 06/14/2011 Document Revised: 12/20/2014 Document Reviewed: 10/31/2013 Elsevier Interactive Patient Education [employer].

## 2015-09-19 LAB — VITAMIN D 25 HYDROXY (VIT D DEFICIENCY, FRACTURES): VIT D 25 HYDROXY: 43 ng/mL (ref 30–100)

## 2015-09-22 LAB — CYTOLOGY - PAP

## 2016-06-25 DIAGNOSIS — R300 Dysuria: Secondary | ICD-10-CM | POA: Diagnosis not present

## 2016-09-22 ENCOUNTER — Other Ambulatory Visit: Payer: Self-pay | Admitting: Women's Health

## 2016-09-22 DIAGNOSIS — Z1231 Encounter for screening mammogram for malignant neoplasm of breast: Secondary | ICD-10-CM

## 2016-10-05 ENCOUNTER — Ambulatory Visit
Admission: RE | Admit: 2016-10-05 | Discharge: 2016-10-05 | Disposition: A | Discharge status: Home / Self Care | Payer: BLUE CROSS/BLUE SHIELD | Source: Ambulatory Visit | Attending: Women's Health | Admitting: Women's Health

## 2016-10-05 DIAGNOSIS — Z1231 Encounter for screening mammogram for malignant neoplasm of breast: Secondary | ICD-10-CM

## 2016-10-20 DIAGNOSIS — H5213 Myopia, bilateral: Secondary | ICD-10-CM | POA: Diagnosis not present

## 2016-10-28 ENCOUNTER — Encounter: Payer: Self-pay | Admitting: Women's Health

## 2016-10-28 ENCOUNTER — Ambulatory Visit (INDEPENDENT_AMBULATORY_CARE_PROVIDER_SITE_OTHER): Payer: BLUE CROSS/BLUE SHIELD | Admitting: Women's Health

## 2016-10-28 VITALS — BP 130/82 | Ht 64.0 in | Wt 123.0 lb

## 2016-10-28 DIAGNOSIS — Z01419 Encounter for gynecological examination (general) (routine) without abnormal findings: Secondary | ICD-10-CM | POA: Diagnosis not present

## 2016-10-28 DIAGNOSIS — Z13220 Encounter for screening for lipoid disorders: Secondary | ICD-10-CM | POA: Diagnosis not present

## 2016-10-28 DIAGNOSIS — Z833 Family history of diabetes mellitus: Secondary | ICD-10-CM

## 2016-10-28 DIAGNOSIS — N946 Dysmenorrhea, unspecified: Secondary | ICD-10-CM

## 2016-10-28 MED ORDER — IBUPROFEN 800 MG PO TABS: 800.0000 mg | ORAL_TABLET | Freq: Three times a day (TID) | ORAL | 1 refills | Status: DC | PRN

## 2016-10-28 NOTE — Progress Notes (Signed)
Christina FileKasey T Froio 1971/05/05 [id number]    History:    Presents for annual exam. Regular monthly 5 day cycles/vasectomy. 2006 LGSIL with normal Paps after. IBS. Normal mammogram history. Continues to have painful cycles which she takes advil 800mg  which helps. Episodes of chest tightness. Denies SOB or palpitations. Saw eye doctor recently has questionable retinopathy. No family history of DM or GDM.    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 Father with heart disease  ROS:  A ROS was performed and pertinent positives and negatives are included.  Exam:  Vitals:   10/28/16 1027  BP: 130/82  Weight: 123 lb (55.8 kg)  Height: 5\' 4"  (1.626 m)   Body mass index is 21.11 kg/m.   General appearance:  Normal Thyroid:  Symmetrical, normal in size, without palpable masses or nodularity. Respiratory  Auscultation:  Clear without wheezing or rhonchi Cardiovascular  Auscultation:  Regular rate, without rubs, murmurs or gallops  Edema/varicosities:  Not grossly evident Abdominal  Soft,nontender, without masses, guarding or rebound.  Liver/spleen:  No organomegaly noted  Hernia:  None appreciated  Skin  Inspection:  Grossly normal   Breasts: Examined lying and sitting.     Right: Without masses, retractions, discharge or axillary adenopathy.     Left: Without masses, retractions, discharge or axillary adenopathy. Gentitourinary   Inguinal/mons:  Normal without inguinal adenopathy  External genitalia:  Normal  BUS/Urethra/Skene's glands:  Normal  Vagina:  Normal  Cervix:  Normal  Uterus: normal in size, shape and contour.  Midline and mobile  Adnexa/parametria:     Rt: Without masses or tenderness.   Lt: Without masses or tenderness.  Anus and perineum: Normal  Digital rectal exam: Normal sphincter tone without palpated masses  or tenderness  Assessment/Plan:  45 y.o. MWF G3P2 for annual exam.  Monthly cycle/Dysmenorrhea/vasectomy 2006 LGSIL with normal Paps after IBS Retinopathy - ophthalmologist managing  Plan: Motrin 800 mg every 8 hours when necessary with cycles prescription given. SBE's, continue 3-D mammograms, regular exercise, calcium rich diet, vitamin D. CBC, lipid panel, CMP, UA. Pap normal with negative HR HPV 2016, new screening guidelines reviewed. Reviewed sonohystogram and ablation if cycles continue to cause distress. Continue follow up with opthalmologist for retinopathy. Report to ER if worsening chest pain.    Harrington ChallengerYOUNG,NANC[facility]NP, 11:36 AM 10/28/2016

## 2016-10-28 NOTE — Patient Instructions (Signed)

## 2016-10-29 LAB — URINALYSIS W MICROSCOPIC + REFLEX CULTURE
BACTERIA UA: NONE SEEN [HPF]
BILIRUBIN URINE: NEGATIVE
Casts: NONE SEEN [LPF]
Crystals: NONE SEEN [HPF]
GLUCOSE, UA: NEGATIVE
Ketones, ur: NEGATIVE
LEUKOCYTES UA: NEGATIVE
NITRITE: NEGATIVE
PH: 6 (ref 5.0–8.0)
PROTEIN: NEGATIVE
RBC / HPF: NONE SEEN RBC/HPF (ref <=2)
SQUAMOUS EPITHELIAL / LPF: NONE SEEN [HPF] (ref <=5)
Specific Gravity, Urine: 1.006 (ref 1.001–1.035)
WBC UA: NONE SEEN WBC/HPF (ref <=5)
Yeast: NONE SEEN [HPF]

## 2016-11-08 ENCOUNTER — Other Ambulatory Visit: Payer: BLUE CROSS/BLUE SHIELD

## 2016-11-08 DIAGNOSIS — Z01419 Encounter for gynecological examination (general) (routine) without abnormal findings: Secondary | ICD-10-CM | POA: Diagnosis not present

## 2016-11-08 DIAGNOSIS — Z833 Family history of diabetes mellitus: Secondary | ICD-10-CM | POA: Diagnosis not present

## 2016-11-08 DIAGNOSIS — Z13220 Encounter for screening for lipoid disorders: Secondary | ICD-10-CM | POA: Diagnosis not present

## 2016-11-09 LAB — COMPREHENSIVE METABOLIC PANEL
ALT: 13 U/L (ref 6–29)
AST: 21 U/L (ref 10–35)
Albumin: 4.2 g/dL (ref 3.6–5.1)
Alkaline Phosphatase: 47 U/L (ref 33–115)
BILIRUBIN TOTAL: 0.4 mg/dL (ref 0.2–1.2)
BUN: 11 mg/dL (ref 7–25)
CHLORIDE: 103 mmol/L (ref 98–110)
CO2: 28 mmol/L (ref 20–31)
CREATININE: 0.66 mg/dL (ref 0.50–1.10)
Calcium: 9.1 mg/dL (ref 8.6–10.2)
Glucose, Bld: 89 mg/dL (ref 65–99)
Potassium: 4.3 mmol/L (ref 3.5–5.3)
SODIUM: 137 mmol/L (ref 135–146)
TOTAL PROTEIN: 6.9 g/dL (ref 6.1–8.1)

## 2016-11-09 LAB — HEMOGLOBIN A1C
Hgb A1c MFr Bld: 5.1 % (ref <5.7)
MEAN PLASMA GLUCOSE: 100 mg/dL

## 2016-11-09 LAB — LIPID PANEL
CHOLESTEROL: 163 mg/dL (ref <200)
HDL: 71 mg/dL (ref >50)
LDL CALC: 83 mg/dL (ref <100)
Total CHOL/HDL Ratio: 2.3 Ratio (ref <5.0)
Triglycerides: 44 mg/dL (ref <150)
VLDL: 9 mg/dL (ref <30)

## 2016-11-09 LAB — CBC WITH DIFFERENTIAL/PLATELET
BASOS ABS: 0 {cells}/uL (ref 0–200)
Basophils Relative: 0 %
EOS PCT: 1 %
Eosinophils Absolute: 72 cells/uL (ref 15–500)
HCT: 40.5 % (ref 35.0–45.0)
HEMOGLOBIN: 13.4 g/dL (ref 11.7–15.5)
LYMPHS ABS: 1872 {cells}/uL (ref 850–3900)
Lymphocytes Relative: 26 %
MCH: 30.2 pg (ref 27.0–33.0)
MCHC: 33.1 g/dL (ref 32.0–36.0)
MCV: 91.2 fL (ref 80.0–100.0)
MONOS PCT: 5 %
MPV: 9.3 fL (ref 7.5–12.5)
Monocytes Absolute: 360 cells/uL (ref 200–950)
NEUTROS ABS: 4896 {cells}/uL (ref 1500–7800)
Neutrophils Relative %: 68 %
PLATELETS: 343 10*3/uL (ref 140–400)
RBC: 4.44 MIL/uL (ref 3.80–5.10)
RDW: 13.1 % (ref 11.0–15.0)
WBC: 7.2 10*3/uL (ref 3.8–10.8)

## 2017-01-24 DIAGNOSIS — H43313 Vitreous membranes and strands, bilateral: Secondary | ICD-10-CM | POA: Diagnosis not present

## 2017-04-23 ENCOUNTER — Emergency Department (HOSPITAL_BASED_OUTPATIENT_CLINIC_OR_DEPARTMENT_OTHER)
Admission: EM | Admit: 2017-04-23 | Discharge: 2017-04-23 | Disposition: A | Discharge status: Home / Self Care | Payer: BLUE CROSS/BLUE SHIELD | Attending: Emergency Medicine | Admitting: Emergency Medicine

## 2017-04-23 ENCOUNTER — Encounter (HOSPITAL_BASED_OUTPATIENT_CLINIC_OR_DEPARTMENT_OTHER): Payer: Self-pay | Admitting: Emergency Medicine

## 2017-04-23 ENCOUNTER — Emergency Department (HOSPITAL_BASED_OUTPATIENT_CLINIC_OR_DEPARTMENT_OTHER): Payer: BLUE CROSS/BLUE SHIELD

## 2017-04-23 DIAGNOSIS — Y929 Unspecified place or not applicable: Secondary | ICD-10-CM | POA: Insufficient documentation

## 2017-04-23 DIAGNOSIS — W19XXXA Unspecified fall, initial encounter: Secondary | ICD-10-CM

## 2017-04-23 DIAGNOSIS — S0181XA Laceration without foreign body of other part of head, initial encounter: Secondary | ICD-10-CM

## 2017-04-23 DIAGNOSIS — S80212A Abrasion, left knee, initial encounter: Secondary | ICD-10-CM | POA: Insufficient documentation

## 2017-04-23 DIAGNOSIS — Y999 Unspecified external cause status: Secondary | ICD-10-CM | POA: Insufficient documentation

## 2017-04-23 DIAGNOSIS — S01111A Laceration without foreign body of right eyelid and periocular area, initial encounter: Secondary | ICD-10-CM | POA: Diagnosis not present

## 2017-04-23 DIAGNOSIS — S80211A Abrasion, right knee, initial encounter: Secondary | ICD-10-CM | POA: Diagnosis not present

## 2017-04-23 DIAGNOSIS — W01198A Fall on same level from slipping, tripping and stumbling with subsequent striking against other object, initial encounter: Secondary | ICD-10-CM | POA: Diagnosis not present

## 2017-04-23 DIAGNOSIS — Y939 Activity, unspecified: Secondary | ICD-10-CM | POA: Diagnosis not present

## 2017-04-23 DIAGNOSIS — S6991XA Unspecified injury of right wrist, hand and finger(s), initial encounter: Secondary | ICD-10-CM | POA: Diagnosis not present

## 2017-04-23 DIAGNOSIS — S40211A Abrasion of right shoulder, initial encounter: Secondary | ICD-10-CM | POA: Diagnosis not present

## 2017-04-23 DIAGNOSIS — S0990XA Unspecified injury of head, initial encounter: Secondary | ICD-10-CM | POA: Diagnosis not present

## 2017-04-23 MED ORDER — LIDOCAINE-EPINEPHRINE 2 %-1:100000 IJ SOLN
20.0000 mL | Freq: Once | INTRAMUSCULAR | Status: DC
  Filled 2017-04-23: qty 1

## 2017-04-23 MED ORDER — ACETAMINOPHEN 325 MG PO TABS
650.0000 mg | ORAL_TABLET | Freq: Once | ORAL | Status: AC
  Administered 2017-04-23: 650 mg via ORAL
  Filled 2017-04-23: qty 2

## 2017-04-23 NOTE — ED Notes (Signed)
pateint states that she was running and tripped over a piece of abnormal cement. The patient has lacerations noted with bleeding controlled to her right eye, under her eye and eye brow. Patient also has swelling and bruising to her right periorbital area. Noted abrasions to her left knee.

## 2017-04-23 NOTE — ED Provider Notes (Signed)
This patient was seen and evaluated by Dr. Dalene SeltzerSchlossman.  I performed the laceration repair with local anesthetic.  Please see Dr. Burr MedicoSchlossman's note for full details.   LACERATION REPAIR Performed by: Christina Hicks Authorized by: Christina Hicks Consent: Verbal consent obtained. Risks and benefits: risks, benefits and alternatives were discussed.  Risks included but not limited to poor wound healing, infection, pain, scar, poor cosmetic result, wound dehiscence, nerve damage Consent given by: patient Patient identity confirmed: provided demographic data, with patient Prepped and Draped in normal sterile fashion Wound explored and probed through full length.  Laceration Location: right eyebrow  Laceration Length: 3.5cm.  No Foreign Bodies seen or palpated  Anesthesia: local infiltration  Local anesthetic: lidocaine 2% with epinephrine was injected through the wound into surrounding tissues.   Anesthetic total: 5 ml  Irrigation method: syringe Amount of cleaning: standard, irrigated with over 500ml sterile saline, chloraprep used on surrounding skin prior to injection.   Skin closure: 6.0 fast gut, well aligned.   Number of sutures: 9  Technique: Simple interrupted   Patient tolerance: Patient tolerated the procedure well with no immediate complications.  Patient EOM remained pain free, unimpaired after suturing.      Christina Hicks, Christina Dehaan Hicks, Christina Hicks 04/23/17 1749    Christina Hicks, Christina Amaker Hicks, Christina Hicks 04/25/17 1015    Alvira MondaySchlossman, Erin, MD 04/27/17 [phone]

## 2017-04-23 NOTE — ED Provider Notes (Signed)
[facility]   CSN: [id number] Arrival date & time: 04/23/17  0858     History   Chief Complaint Chief Complaint  Patient presents with  . Fall    HPI Christina Hicks is a 46 y.o. female.  HPI   Running this morning and tripped over uneven area of concrete and fell around 815AM.   Hit knees hands, shoulder then face, right side of face. 4/10 pain to area.  Knee feels stiff, hands sore, mild. Shoulder with abrasion but feels fine. Neck and shoulder improved. Up to date on tetanus.  Some nausea on the way in. No change in vision.   Past Medical History:  Diagnosis Date  . CIN I (cervical intraepithelial neoplasia I)   . Deviated septum   . IBS (irritable bowel syndrome)   . Migraine   . Ovarian cyst     Patient Active Problem List   Diagnosis Date Noted  . Deviated septum   . Migraine   . CIN I (cervical intraepithelial neoplasia I)   . Ovarian cyst   . IBS (irritable bowel syndrome)     History reviewed. No pertinent surgical history.  OB History    Gravida Para Term Preterm AB Living   3 2 2   1 2    SAB TAB Ectopic Multiple Live Births                   Home Medications    Prior to Admission medications   Medication Sig Start Date End Date Taking? Authorizing Provider  acetaminophen (TYLENOL) 500 MG tablet Take 500 mg by mouth every 6 (six) hours as needed.    [provider]  Calcium Carbonate Antacid (TUMS E-X PO) Take by mouth.    [provider]  Ibuprofen (ADVIL PO) Take by mouth.    [provider]  ibuprofen (ADVIL,MOTRIN) 800 MG tablet Take 1 tablet (800 mg total) by mouth every 8 (eight) hours as needed. 10/28/16   Harrington Challenger, NP  Triamcinolone Acetonide (NASACORT AQ NA) Place into the nose.    [provider]    Family History Family History  Problem Relation Age of Onset  . Hypertension Mother   . Heart disease Father     Social History Social History  Substance Use  Topics  . Smoking status: Never Smoker  . Smokeless tobacco: Never Used  . Alcohol use 0.0 oz/week     Comment: Rare     Allergies   Azithromycin and Sulfa antibiotics   Review of Systems Review of Systems  Constitutional: Negative for fever.  HENT: Negative for sore throat.   Eyes: Negative for visual disturbance.  Respiratory: Negative for cough and shortness of breath.   Cardiovascular: Negative for chest pain.  Gastrointestinal: Negative for abdominal pain.  Genitourinary: Negative for difficulty urinating.  Musculoskeletal: Negative for back pain and neck pain.  Skin: Positive for wound. Negative for rash.  Neurological: Positive for headaches. Negative for syncope.     Physical Exam Updated Vital Signs BP (!) 134/97 (BP Location: Right Arm)   Pulse 64   Temp 98.5 F (36.9 C) (Oral)   Resp 18   Ht 5\' 4"  (1.626 m)   Wt 122 lb (55.3 kg)   LMP 03/25/2017   SpO2 100%   BMI 20.94 kg/m   Physical Exam  Constitutional: She is oriented to person, place, and time. She appears well-developed and well-nourished. No distress.  HENT:  Head: Normocephalic.  Right supraorbital tenderness, contusion, laceration through eyebrow approx 4cm, laceration inferior 2cm  Superficial laceration 1cm inferior to right eye  Eyes: Conjunctivae and EOM are normal. Pupils are equal, round, and reactive to light.  Neck: Normal range of motion.  Cardiovascular: Normal rate, regular rhythm, normal heart sounds and intact distal pulses.  Exam reveals no gallop and no friction rub.   No murmur heard. Pulmonary/Chest: Effort normal and breath sounds normal. No respiratory distress. She has no wheezes. She has no rales.  Abdominal: Soft. She exhibits no distension. There is no tenderness. There is no guarding.  Musculoskeletal: She exhibits no edema.       Right wrist: She exhibits no bony tenderness.       Right knee: She exhibits normal range of motion, no swelling and no effusion. No  tenderness found.       Left knee: She exhibits normal range of motion, no swelling and no deformity. Tenderness found.       Cervical back: She exhibits no tenderness and no bony tenderness.       Thoracic back: She exhibits no bony tenderness.       Lumbar back: She exhibits no bony tenderness.       Right hand: She exhibits tenderness (ulnar side of hand).  Neurological: She is alert and oriented to person, place, and time.  Skin: Skin is warm and dry. Abrasion (right shoulder, left knee, right knee) noted. No rash noted. She is not diaphoretic. No erythema.  Nursing note and vitals reviewed.    ED Treatments / Results  Labs (all labs ordered are listed, but only abnormal results are displayed) Labs Reviewed - No data to display  EKG  EKG Interpretation None       Radiology Ct Head Wo Contrast  Result Date: 04/23/2017 CLINICAL DATA:  Fall running, right eyebrow laceration, left knee abrasions. EXAM: CT HEAD WITHOUT CONTRAST CT MAXILLOFACIAL WITHOUT CONTRAST TECHNIQUE: Multidetector CT imaging of the head and maxillofacial structures were performed using the standard protocol without intravenous contrast. Multiplanar CT image reconstructions of the maxillofacial structures were also generated. COMPARISON:  None. FINDINGS: CT HEAD FINDINGS Brain: No evidence of an acute infarct, acute hemorrhage, mass lesion, mass effect or hydrocephalus. Vascular: No hyperdense vessel or unexpected calcification. Skull: No fracture.  Right rib nasal septum deviation. Other: Clear paranasal sinuses and mastoid air cells. CT MAXILLOFACIAL FINDINGS Osseous: No fracture.  Right rete nasal septum deviation. Orbits: Negative. No traumatic or inflammatory finding. Sinuses: Clear. Soft tissues: Right facial soft tissue swelling. IMPRESSION: 1. No evidence of acute intracranial trauma or facial fracture. 2. Right facial soft tissue swelling. 3. Rightward nasal septum deviation. Electronically Signed   By: Leanna BattlesMelinda   Blietz M.D.   On: 04/23/2017 10:40   Dg Knee Complete 4 Views Left  Result Date: 04/23/2017 CLINICAL DATA:  Pt fell today while running, states she tripped on an raised piece of cement falling forward, laceration to right eyebrow, abrasions to left knee and right hand Left knee stiffness Denies chance of pregnancy EXAM: LEFT KNEE - COMPLETE 4+ VIEW COMPARISON:  None. FINDINGS: Osseous alignment is normal. Bone mineralization is normal. No fracture line or displaced fracture fragment seen. No convincing joint effusion and overlying soft tissues are unremarkable. IMPRESSION: Negative. Electronically Signed   By: Bary RichardStan  Maynard M.D.   On: 04/23/2017 10:38   Dg Hand Complete Right  Result Date: 04/23/2017 CLINICAL DATA:  Pt fell today while running, states she tripped on an raised piece of  cement falling forward, laceration to right eyebrow, abrasions to left knee and right hand EXAM: RIGHT HAND - COMPLETE 3+ VIEW COMPARISON:  None. FINDINGS: Osseous alignment is normal. No fracture line or displaced fracture fragment. Adjacent soft tissues are unremarkable. IMPRESSION: Negative. Electronically Signed   By: Bary Richard M.D.   On: 04/23/2017 10:37   Ct Maxillofacial Wo Contrast  Result Date: 04/23/2017 CLINICAL DATA:  Fall running, right eyebrow laceration, left knee abrasions. EXAM: CT HEAD WITHOUT CONTRAST CT MAXILLOFACIAL WITHOUT CONTRAST TECHNIQUE: Multidetector CT imaging of the head and maxillofacial structures were performed using the standard protocol without intravenous contrast. Multiplanar CT image reconstructions of the maxillofacial structures were also generated. COMPARISON:  None. FINDINGS: CT HEAD FINDINGS Brain: No evidence of an acute infarct, acute hemorrhage, mass lesion, mass effect or hydrocephalus. Vascular: No hyperdense vessel or unexpected calcification. Skull: No fracture.  Right rib nasal septum deviation. Other: Clear paranasal sinuses and mastoid air cells. CT MAXILLOFACIAL  FINDINGS Osseous: No fracture.  Right rete nasal septum deviation. Orbits: Negative. No traumatic or inflammatory finding. Sinuses: Clear. Soft tissues: Right facial soft tissue swelling. IMPRESSION: 1. No evidence of acute intracranial trauma or facial fracture. 2. Right facial soft tissue swelling. 3. Rightward nasal septum deviation. Electronically Signed   By: Leanna Battles M.D.   On: 04/23/2017 10:40    Procedures Procedures (including critical care time)  Medications Ordered in ED Medications  acetaminophen (TYLENOL) tablet 650 mg (650 mg Oral Given 04/23/17 1008)     Initial Impression / Assessment and Plan / ED Course  I have reviewed the triage vital signs and the nursing notes.  Pertinent labs & imaging results that were available during my care of the patient were reviewed by me and considered in my medical decision making (see chart for details).    46yo female presents with concern for fall while running with facial pain, laceration to her face, knee pain, left hand pain.  XR negative. No snuff box tenderness. CT head/face shows no sign of fracture or bleed. Absorbable sutures placed by Merceda Elks.  Patient discharged in stable condition with understanding of reasons to return.    Final Clinical Impressions(s) / ED Diagnoses   Final diagnoses:  Fall, initial encounter  Facial laceration, initial encounter    New Prescriptions Discharge Medication List as of 04/23/2017 12:57 PM       Alvira Monday, MD 04/24/17 2241

## 2017-04-27 ENCOUNTER — Encounter: Payer: Self-pay | Admitting: Gynecology

## 2017-10-17 ENCOUNTER — Other Ambulatory Visit: Payer: Self-pay | Admitting: Women's Health

## 2017-10-17 DIAGNOSIS — Z1231 Encounter for screening mammogram for malignant neoplasm of breast: Secondary | ICD-10-CM

## 2017-10-31 ENCOUNTER — Ambulatory Visit
Admission: RE | Admit: 2017-10-31 | Discharge: 2017-10-31 | Disposition: A | Discharge status: Home / Self Care | Payer: BLUE CROSS/BLUE SHIELD | Source: Ambulatory Visit | Attending: Women's Health | Admitting: Women's Health

## 2017-10-31 DIAGNOSIS — Z1231 Encounter for screening mammogram for malignant neoplasm of breast: Secondary | ICD-10-CM

## 2017-11-01 ENCOUNTER — Encounter: Payer: Self-pay | Admitting: Women's Health

## 2017-11-23 ENCOUNTER — Encounter: Payer: BLUE CROSS/BLUE SHIELD | Admitting: Women's Health

## 2017-12-26 ENCOUNTER — Ambulatory Visit (INDEPENDENT_AMBULATORY_CARE_PROVIDER_SITE_OTHER): Payer: BLUE CROSS/BLUE SHIELD | Admitting: Women's Health

## 2017-12-26 ENCOUNTER — Encounter: Payer: Self-pay | Admitting: Women's Health

## 2017-12-26 VITALS — BP 130/80 | Ht 64.0 in | Wt 125.0 lb

## 2017-12-26 DIAGNOSIS — N946 Dysmenorrhea, unspecified: Secondary | ICD-10-CM | POA: Diagnosis not present

## 2017-12-26 DIAGNOSIS — Z01419 Encounter for gynecological examination (general) (routine) without abnormal findings: Secondary | ICD-10-CM | POA: Diagnosis not present

## 2017-12-26 MED ORDER — AMOXICILLIN 500 MG PO CAPS: 500.0000 mg | ORAL_CAPSULE | Freq: Three times a day (TID) | ORAL | 0 refills | Status: DC

## 2017-12-26 MED ORDER — IBUPROFEN 800 MG PO TABS: 800.0000 mg | ORAL_TABLET | Freq: Three times a day (TID) | ORAL | 1 refills | Status: AC | PRN

## 2017-12-26 NOTE — Progress Notes (Signed)
l °

## 2017-12-26 NOTE — Progress Notes (Signed)
Christina FileKasey T Cabler [birth year] [id number]    History:    Presents for annual exam.  Monthly 5 day cycle/vasectomy. Dysmenorrhea using Motrin as needed. Normal mammogram history. 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 History of IBS and migraines. Complaining of sinus pressure, pain in teeth/ jaw area fatigue, and generally not feeling well for the past week, has been using over-the-counter products with minimal relief. History of sinus infections.  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  Exam:  Vitals:   12/26/17 1553  BP: 130/80  Weight: 125 lb (56.7 kg)  Height: 5\' 4"  (1.626 m)   Body mass index is 21.46 kg/m.   General appearance:  Normal Thyroid:  Symmetrical, normal in size, without palpable masses or nodularity. Respiratory  Auscultation:  Clear without wheezing or rhonchi Cardiovascular  Auscultation:  Regular rate, without rubs, murmurs or gallops  Edema/varicosities:  Not grossly evident Abdominal  Soft,nontender, without masses, guarding or rebound.  Liver/spleen:  No organomegaly noted  Hernia:  None appreciated  Skin  Inspection:  Grossly normal   Breasts: Examined lying and sitting.     Right: Without masses, retractions, discharge or axillary adenopathy.     Left: Without masses, retractions, discharge or axillary adenopathy. Gentitourinary   Inguinal/mons:  Normal without inguinal adenopathy  External genitalia:  Normal  BUS/Urethra/Skene's glands:  Normal  Vagina:  Normal  Cervix:  Normal  Uterus:   normal in size, shape and contour.  Midline and mobile  Adnexa/parametria:     Rt: Without masses or tenderness.   Lt: Without masses or tenderness.  Anus and perineum: Normal  Digital rectal exam: Normal sphincter  tone without palpated masses or tenderness  Assessment/Plan:  47 y.o. MWF G3 P2 for annual exam.  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  Plan: Sinus infection reviewed, will continue increased rest, over-the-counter products. If symptoms persist/worsen Amoxil 500 mg 3 times daily for 5 days, prescription given. SBE's, continue annual screening mammogram, calcium rich diet, vitamin D 1000 daily encouraged. Continue active lifestyle with regular exercise and running. CBC, CMP. We'll check lipid panel next year instructed to come fasting. Pap with HR HPV typing.  Harrington Challengerancy J [facility]WHNP, 4:43 PM 12/26/2017

## 2017-12-26 NOTE — Patient Instructions (Signed)
Health Maintenance, Female Adopting a healthy lifestyle and getting preventive care can go a long way to promote health and wellness. Talk with your health care provider about what schedule of regular examinations is right for you. This is a good chance for you to check in with your provider about disease prevention and staying healthy. In between checkups, there are plenty of things you can do on your own. Experts have done a lot of research about which lifestyle changes and preventive measures are most likely to keep you healthy. Ask your health care provider for more information. Weight and diet Eat a healthy diet  Be sure to include plenty of vegetables, fruits, low-fat dairy products, and lean protein.  Do not eat a lot of foods high in solid fats, added sugars, or salt.  Get regular exercise. This is one of the most important things you can do for your health. ? Most adults should exercise for at least 150 minutes each week. The exercise should increase your heart rate and make you sweat (moderate-intensity exercise). ? Most adults should also do strengthening exercises at least twice a week. This is in addition to the moderate-intensity exercise.  Maintain a healthy weight  Body mass index (BMI) is a measurement that can be used to identify possible weight problems. It estimates body fat based on height and weight. Your health care provider can help determine your BMI and help you achieve or maintain a healthy weight.  For females 20 years of age and older: ? A BMI below 18.5 is considered underweight. ? A BMI of 18.5 to 24.9 is normal. ? A BMI of 25 to 29.9 is considered overweight. ? A BMI of 30 and above is considered obese.  Watch levels of cholesterol and blood lipids  You should start having your blood tested for lipids and cholesterol at 47 years of age, then have this test every 5 years.  You may need to have your cholesterol levels checked more often if: ? Your lipid or  cholesterol levels are high. ? You are older than 47 years of age. ? You are at high risk for heart disease.  Cancer screening Lung Cancer  Lung cancer screening is recommended for adults 55-80 years old who are at high risk for lung cancer because of a history of smoking.  A yearly low-dose CT scan of the lungs is recommended for people who: ? Currently smoke. ? Have quit within the past 15 years. ? Have at least a 30-pack-year history of smoking. A pack year is smoking an average of one pack of cigarettes a day for 1 year.  Yearly screening should continue until it has been 15 years since you quit.  Yearly screening should stop if you develop a health problem that would prevent you from having lung cancer treatment.  Breast Cancer  Practice breast self-awareness. This means understanding how your breasts normally appear and feel.  It also means doing regular breast self-exams. Let your health care provider know about any changes, no matter how small.  If you are in your 20s or 30s, you should have a clinical breast exam (CBE) by a health care provider every 1-3 years as part of a regular health exam.  If you are 40 or older, have a CBE every year. Also consider having a breast X-ray (mammogram) every year.  If you have a family history of breast cancer, talk to your health care provider about genetic screening.  If you are at high risk   for breast cancer, talk to your health care provider about having an MRI and a mammogram every year.  Breast cancer gene (BRCA) assessment is recommended for women who have family members with BRCA-related cancers. BRCA-related cancers include: ? Breast. ? Ovarian. ? Tubal. ? Peritoneal cancers.  Results of the assessment will determine the need for genetic counseling and BRCA1 and BRCA2 testing.  Cervical Cancer Your health care provider may recommend that you be screened regularly for cancer of the pelvic organs (ovaries, uterus, and  vagina). This screening involves a pelvic examination, including checking for microscopic changes to the surface of your cervix (Pap test). You may be encouraged to have this screening done every 3 years, beginning at age 22.  For women ages 56-65, health care providers may recommend pelvic exams and Pap testing every 3 years, or they may recommend the Pap and pelvic exam, combined with testing for human papilloma virus (HPV), every 5 years. Some types of HPV increase your risk of cervical cancer. Testing for HPV may also be done on women of any age with unclear Pap test results.  Other health care providers may not recommend any screening for nonpregnant women who are considered low risk for pelvic cancer and who do not have symptoms. Ask your health care provider if a screening pelvic exam is right for you.  If you have had past treatment for cervical cancer or a condition that could lead to cancer, you need Pap tests and screening for cancer for at least 20 years after your treatment. If Pap tests have been discontinued, your risk factors (such as having a new sexual partner) need to be reassessed to determine if screening should resume. Some women have medical problems that increase the chance of getting cervical cancer. In these cases, your health care provider may recommend more frequent screening and Pap tests.  Colorectal Cancer  This type of cancer can be detected and often prevented.  Routine colorectal cancer screening usually begins at 47 years of age and continues through 47 years of age.  Your health care provider may recommend screening at an earlier age if you have risk factors for colon cancer.  Your health care provider may also recommend using home test kits to check for hidden blood in the stool.  A small camera at the end of a tube can be used to examine your colon directly (sigmoidoscopy or colonoscopy). This is done to check for the earliest forms of colorectal  cancer.  Routine screening usually begins at age 33.  Direct examination of the colon should be repeated every 5-10 years through 47 years of age. However, you may need to be screened more often if early forms of precancerous polyps or small growths are found.  Skin Cancer  Check your skin from head to toe regularly.  Tell your health care provider about any new moles or changes in moles, especially if there is a change in a mole's shape or color.  Also tell your health care provider if you have a mole that is larger than the size of a pencil eraser.  Always use sunscreen. Apply sunscreen liberally and repeatedly throughout the day.  Protect yourself by wearing long sleeves, pants, a wide-brimmed hat, and sunglasses whenever you are outside.  Heart disease, diabetes, and high blood pressure  High blood pressure causes heart disease and increases the risk of stroke. High blood pressure is more likely to develop in: ? People who have blood pressure in the high end of  the normal range (130-139/85-89 mm Hg). ? People who are overweight or obese. ? People who are African American.  If you are 21-29 years of age, have your blood pressure checked every 3-5 years. If you are 3 years of age or older, have your blood pressure checked every year. You should have your blood pressure measured twice-once when you are at a hospital or clinic, and once when you are not at a hospital or clinic. Record the average of the two measurements. To check your blood pressure when you are not at a hospital or clinic, you can use: ? An automated blood pressure machine at a pharmacy. ? A home blood pressure monitor.  If you are between 17 years and 37 years old, ask your health care provider if you should take aspirin to prevent strokes.  Have regular diabetes screenings. This involves taking a blood sample to check your fasting blood sugar level. ? If you are at a normal weight and have a low risk for diabetes,  have this test once every three years after 47 years of age. ? If you are overweight and have a high risk for diabetes, consider being tested at a younger age or more often. Preventing infection Hepatitis B  If you have a higher risk for hepatitis B, you should be screened for this virus. You are considered at high risk for hepatitis B if: ? You were born in a country where hepatitis B is common. Ask your health care provider which countries are considered high risk. ? Your parents were born in a high-risk country, and you have not been immunized against hepatitis B (hepatitis B vaccine). ? You have HIV or AIDS. ? You use needles to inject street drugs. ? You live with someone who has hepatitis B. ? You have had sex with someone who has hepatitis B. ? You get hemodialysis treatment. ? You take certain medicines for conditions, including cancer, organ transplantation, and autoimmune conditions.  Hepatitis C  Blood testing is recommended for: ? Everyone born from [birth year] through 1965. ? Anyone with known risk factors for hepatitis C.  Sexually transmitted infections (STIs)  You should be screened for sexually transmitted infections (STIs) including gonorrhea and chlamydia if: ? You are sexually active and are younger than 47 years of age. ? You are older than 47 years of age and your health care provider tells you that you are at risk for this type of infection. ? Your sexual activity has changed since you were last screened and you are at an increased risk for chlamydia or gonorrhea. Ask your health care provider if you are at risk.  If you do not have HIV, but are at risk, it may be recommended that you take a prescription medicine daily to prevent HIV infection. This is called pre-exposure prophylaxis (PrEP). You are considered at risk if: ? You are sexually active and do not regularly use condoms or know the HIV status of your partner(s). ? You take drugs by injection. ? You are  sexually active with a partner who has HIV.  Talk with your health care provider about whether you are at high risk of being infected with HIV. If you choose to begin PrEP, you should first be tested for HIV. You should then be tested every 3 months for as long as you are taking PrEP. Pregnancy  If you are premenopausal and you may become pregnant, ask your health care provider about preconception counseling.  If you may become  pregnant, take 400 to 800 micrograms (mcg) of folic acid every day.  If you want to prevent pregnancy, talk to your health care provider about birth control (contraception). Osteoporosis and menopause  Osteoporosis is a disease in which the bones lose minerals and strength with aging. This can result in serious bone fractures. Your risk for osteoporosis can be identified using a bone density scan.  If you are 76 years of age or older, or if you are at risk for osteoporosis and fractures, ask your health care provider if you should be screened.  Ask your health care provider whether you should take a calcium or vitamin D supplement to lower your risk for osteoporosis.  Menopause may have certain physical symptoms and risks.  Hormone replacement therapy may reduce some of these symptoms and risks. Talk to your health care provider about whether hormone replacement therapy is right for you. Follow these instructions at home:  Schedule regular health, dental, and eye exams.  Stay current with your immunizations.  Do not use any tobacco products including cigarettes, chewing tobacco, or electronic cigarettes.  If you are pregnant, do not drink alcohol.  If you are breastfeeding, limit how much and how often you drink alcohol.  Limit alcohol intake to no more than 1 drink per day for nonpregnant women. One drink equals 12 ounces of beer, 5 ounces of wine, or 1 ounces of hard liquor.  Do not use street drugs.  Do not share needles.  Ask your health care  provider for help if you need support or information about quitting drugs.  Tell your health care provider if you often feel depressed.  Tell your health care provider if you have ever been abused or do not feel safe at home. This information is not intended to replace advice given to you by your health care provider. Make sure you discuss any questions you have with your health care provider. Document Released: 06/14/2011 Document Revised: 05/06/2016 Document Reviewed: 09/02/2015 Elsevier Interactive Patient Education  2018 [employer]. Sinusitis, Adult Sinusitis is soreness and inflammation of your sinuses. Sinuses are hollow spaces in the bones around your face. Your sinuses are located:  Around your eyes.  In the middle of your forehead.  Behind your nose.  In your cheekbones.  Your sinuses and nasal passages are lined with a stringy fluid (mucus). Mucus normally drains out of your sinuses. When your nasal tissues become inflamed or swollen, the mucus can become trapped or blocked so air cannot flow through your sinuses. This allows bacteria, viruses, and funguses to grow, which leads to infection. Sinusitis can develop quickly and last for 7?10 days (acute) or for more than 12 weeks (chronic). Sinusitis often develops after a cold. What are the causes? This condition is caused by anything that creates swelling in the sinuses or stops mucus from draining, including:  Allergies.  Asthma.  Bacterial or viral infection.  Abnormally shaped bones between the nasal passages.  Nasal growths that contain mucus (nasal polyps).  Narrow sinus openings.  Pollutants, such as chemicals or irritants in the air.  A foreign object stuck in the nose.  A fungal infection. This is rare.  What increases the risk? The following factors may make you more likely to develop this condition:  Having allergies or asthma.  Having had a recent cold or respiratory tract infection.  Having  structural deformities or blockages in your nose or sinuses.  Having a weak immune system.  Doing a lot of swimming or  diving.  Overusing nasal sprays.  Smoking.  What are the signs or symptoms? The main symptoms of this condition are pain and a feeling of pressure around the affected sinuses. Other symptoms include:  Upper toothache.  Earache.  Headache.  Bad breath.  Decreased sense of smell and taste.  A cough that may get worse at night.  Fatigue.  Fever.  Thick drainage from your nose. The drainage is often green and it may contain pus (purulent).  Stuffy nose or congestion.  Postnasal drip. This is when extra mucus collects in the throat or back of the nose.  Swelling and warmth over the affected sinuses.  Sore throat.  Sensitivity to light.  How is this diagnosed? This condition is diagnosed based on symptoms, a medical history, and a physical exam. To find out if your condition is acute or chronic, your health care provider may:  Look in your nose for signs of nasal polyps.  Tap over the affected sinus to check for signs of infection.  View the inside of your sinuses using an imaging device that has a light attached (endoscope).  If your health care provider suspects that you have chronic sinusitis, you may also:  Be tested for allergies.  Have a sample of mucus taken from your nose (nasal culture) and checked for bacteria.  Have a mucus sample examined to see if your sinusitis is related to an allergy.  If your sinusitis does not respond to treatment and it lasts longer than 8 weeks, you may have an MRI or CT scan to check your sinuses. These scans also help to determine how severe your infection is. In rare cases, a bone biopsy may be done to rule out more serious types of fungal sinus disease. How is this treated? Treatment for sinusitis depends on the cause and whether your condition is chronic or acute. If a virus is causing your sinusitis,  your symptoms will go away on their own within 10 days. You may be given medicines to relieve your symptoms, including:  Topical nasal decongestants. They shrink swollen nasal passages and let mucus drain from your sinuses.  Antihistamines. These drugs block inflammation that is triggered by allergies. This can help to ease swelling in your nose and sinuses.  Topical nasal corticosteroids. These are nasal sprays that ease inflammation and swelling in your nose and sinuses.  Nasal saline washes. These rinses can help to get rid of thick mucus in your nose.  If your condition is caused by bacteria, you will be given an antibiotic medicine. If your condition is caused by a fungus, you will be given an antifungal medicine. Surgery may be needed to correct underlying conditions, such as narrow nasal passages. Surgery may also be needed to remove polyps. Follow these instructions at home: Medicines  Take, use, or apply over-the-counter and prescription medicines only as told by your health care provider. These may include nasal sprays.  If you were prescribed an antibiotic medicine, take it as told by your health care provider. Do not stop taking the antibiotic even if you start to feel better. Hydrate and Humidify  Drink enough water to keep your urine clear or pale yellow. Staying hydrated will help to thin your mucus.  Use a cool mist humidifier to keep the humidity level in your home above 50%.  Inhale steam for 10-15 minutes, 3-4 times a day or as told by your health care provider. You can do this in the bathroom while a hot shower is  running.  Limit your exposure to cool or dry air. Rest  Rest as much as possible.  Sleep with your head raised (elevated).  Make sure to get enough sleep each night. General instructions  Apply a warm, moist washcloth to your face 3-4 times a day or as told by your health care provider. This will help with discomfort.  Wash your hands often with  soap and water to reduce your exposure to viruses and other germs. If soap and water are not available, use hand sanitizer.  Do not smoke. Avoid being around people who are smoking (secondhand smoke).  Keep all follow-up visits as told by your health care provider. This is important. Contact a health care provider if:  You have a fever.  Your symptoms get worse.  Your symptoms do not improve within 10 days. Get help right away if:  You have a severe headache.  You have persistent vomiting.  You have pain or swelling around your face or eyes.  You have vision problems.  You develop confusion.  Your neck is stiff.  You have trouble breathing. This information is not intended to replace advice given to you by your health care provider. Make sure you discuss any questions you have with your health care provider. Document Released: 11/29/2005 Document Revised: 07/25/2016 Document Reviewed: 09/24/2015 Elsevier Interactive Patient Education  [employer].

## 2017-12-27 ENCOUNTER — Telehealth: Payer: Self-pay | Admitting: Obstetrics and Gynecology

## 2017-12-27 LAB — CBC WITH DIFFERENTIAL/PLATELET
Basophils Absolute: 78 cells/uL (ref 0–200)
Basophils Relative: 0.9 %
EOS ABS: 157 {cells}/uL (ref 15–500)
Eosinophils Relative: 1.8 %
HCT: 40 % (ref 35.0–45.0)
Hemoglobin: 13.3 g/dL (ref 11.7–15.5)
Lymphs Abs: 1836 cells/uL (ref 850–3900)
MCH: 30.3 pg (ref 27.0–33.0)
MCHC: 33.3 g/dL (ref 32.0–36.0)
MCV: 91.1 fL (ref 80.0–100.0)
MPV: 10 fL (ref 7.5–12.5)
Monocytes Relative: 11 %
NEUTROS PCT: 65.2 %
Neutro Abs: 5672 cells/uL (ref 1500–7800)
PLATELETS: 354 10*3/uL (ref 140–400)
RBC: 4.39 10*6/uL (ref 3.80–5.10)
RDW: 12.2 % (ref 11.0–15.0)
TOTAL LYMPHOCYTE: 21.1 %
WBC mixed population: 957 cells/uL — ABNORMAL HIGH (ref 200–950)
WBC: 8.7 10*3/uL (ref 3.8–10.8)

## 2017-12-27 LAB — COMPREHENSIVE METABOLIC PANEL
AG Ratio: 1.6 (calc) (ref 1.0–2.5)
ALKALINE PHOSPHATASE (APISO): 51 U/L (ref 33–115)
ALT: 13 U/L (ref 6–29)
AST: 17 U/L (ref 10–35)
Albumin: 4.4 g/dL (ref 3.6–5.1)
BUN: 15 mg/dL (ref 7–25)
CO2: 27 mmol/L (ref 20–32)
Calcium: 9.3 mg/dL (ref 8.6–10.2)
Chloride: 100 mmol/L (ref 98–110)
Creat: 0.89 mg/dL (ref 0.50–1.10)
Globulin: 2.8 g/dL (calc) (ref 1.9–3.7)
Glucose, Bld: 36 mg/dL — CL (ref 65–99)
Potassium: 4.1 mmol/L (ref 3.5–5.3)
Sodium: 139 mmol/L (ref 135–146)
Total Bilirubin: 0.3 mg/dL (ref 0.2–1.2)
Total Protein: 7.2 g/dL (ref 6.1–8.1)

## 2017-12-27 NOTE — Telephone Encounter (Signed)
Received a call from the lab with a critical lab value. The patient's fasting glucose level from yesterday 12/26/16 was 36.  Called the patient, left a message. Her home # has been disconnected.  Per DPR from 2014, called the patient's husband Scott. I spoke with Lorin PicketScott, he said Rosanne SackKasey is fine, just sleeping. She has a sinus infection. I asked him to have her call the office in the am to schedule more blood work.

## 2017-12-28 ENCOUNTER — Other Ambulatory Visit: Payer: Self-pay | Admitting: Women's Health

## 2017-12-28 DIAGNOSIS — E162 Hypoglycemia, unspecified: Secondary | ICD-10-CM

## 2017-12-28 LAB — PAP, TP IMAGING W/ HPV RNA, RFLX HPV TYPE 16,18/45: HPV DNA HIGH RISK: NOT DETECTED

## 2017-12-28 NOTE — Telephone Encounter (Signed)
Telephone call, states is feeling fine, denies vertigo, nausea, perspiration. Recently had a sinus infection is doing much better. States appetite is better. Stay-at-home from work the last 2 days due to illness but is planning to go to work today. Will come to the office for a hemoglobin A1c for reassurance. Denied  symptoms of low blood sugar.

## 2017-12-29 ENCOUNTER — Other Ambulatory Visit: Payer: BLUE CROSS/BLUE SHIELD

## 2017-12-29 DIAGNOSIS — E162 Hypoglycemia, unspecified: Secondary | ICD-10-CM

## 2017-12-30 LAB — HEMOGLOBIN A1C
HEMOGLOBIN A1C: 5.5 %{Hb} (ref <5.7)
Mean Plasma Glucose: 111 (calc)
eAG (mmol/L): 6.2 (calc)

## 2018-01-07 DIAGNOSIS — H109 Unspecified conjunctivitis: Secondary | ICD-10-CM | POA: Diagnosis not present

## 2018-01-07 DIAGNOSIS — J0140 Acute pansinusitis, unspecified: Secondary | ICD-10-CM | POA: Diagnosis not present

## 2018-01-25 DIAGNOSIS — H5213 Myopia, bilateral: Secondary | ICD-10-CM | POA: Diagnosis not present

## 2018-02-28 DIAGNOSIS — D225 Melanocytic nevi of trunk: Secondary | ICD-10-CM | POA: Diagnosis not present

## 2018-04-05 IMAGING — CR DG KNEE COMPLETE 4+V*L*
4 series · 4 of 4 positions shown · non-contrast
Comparison: None.

CLINICAL DATA: Pt fell today while running, states she tripped on
an raised piece of cement falling forward, laceration to right
eyebrow, abrasions to left knee and right hand Left knee stiffness
Denies chance of pregnancy

EXAM:
LEFT KNEE - COMPLETE 4+ VIEW

[t knee ap left]
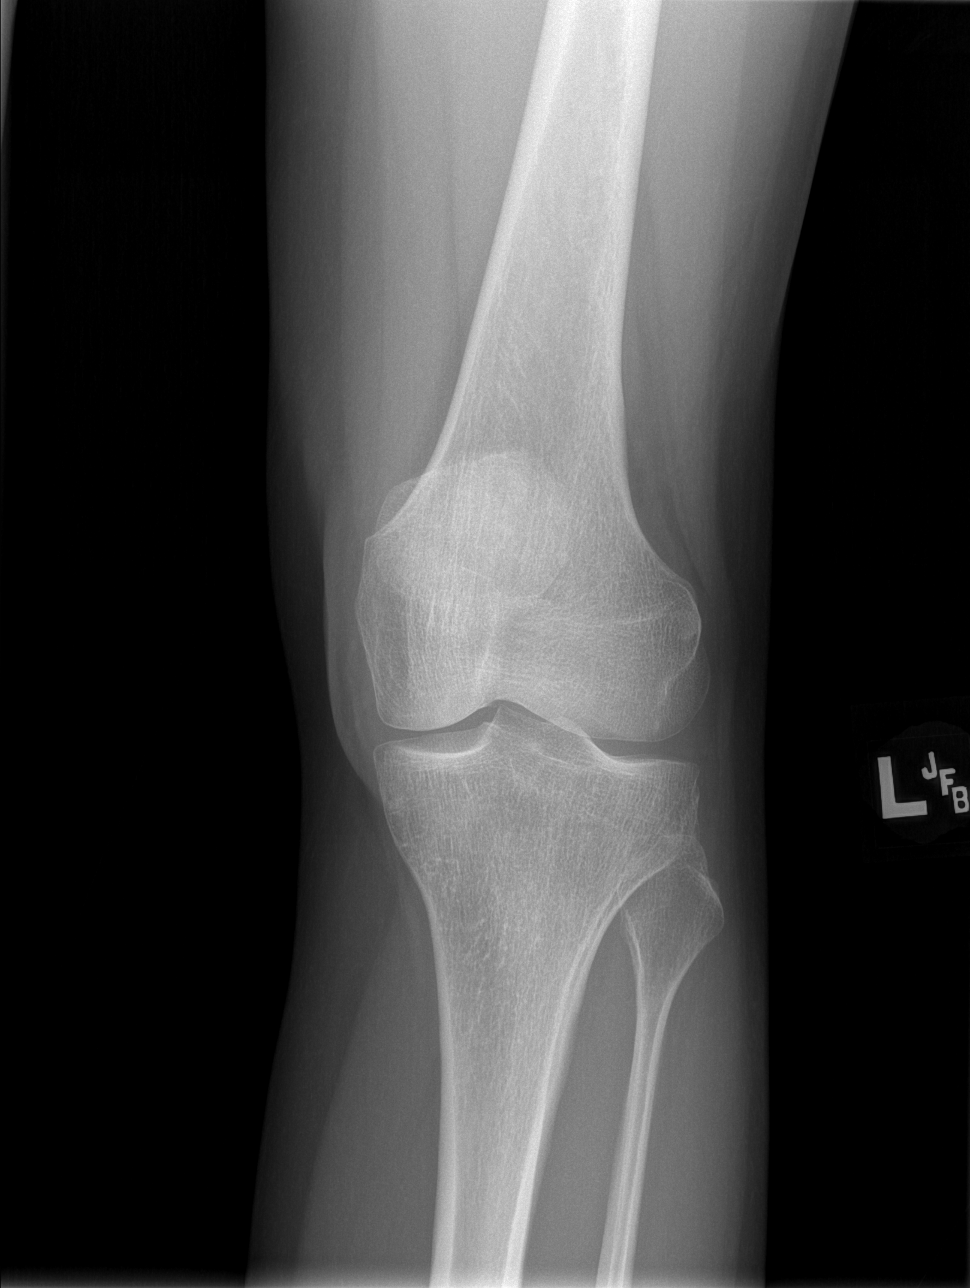

[t knee oblique left (1 of 2)]
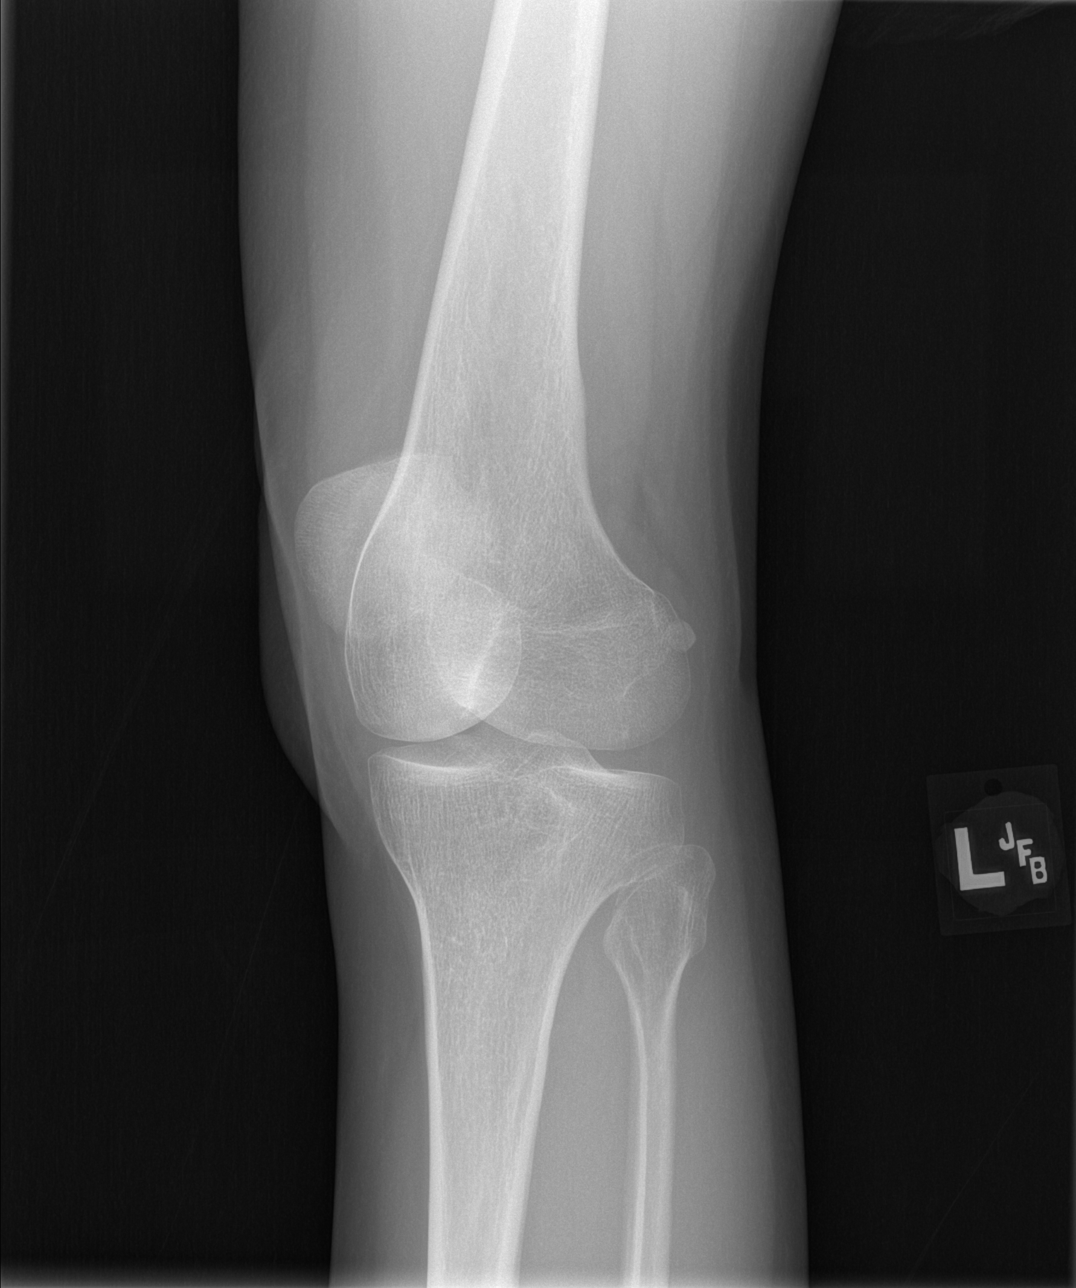

[t knee oblique left (2 of 2)]
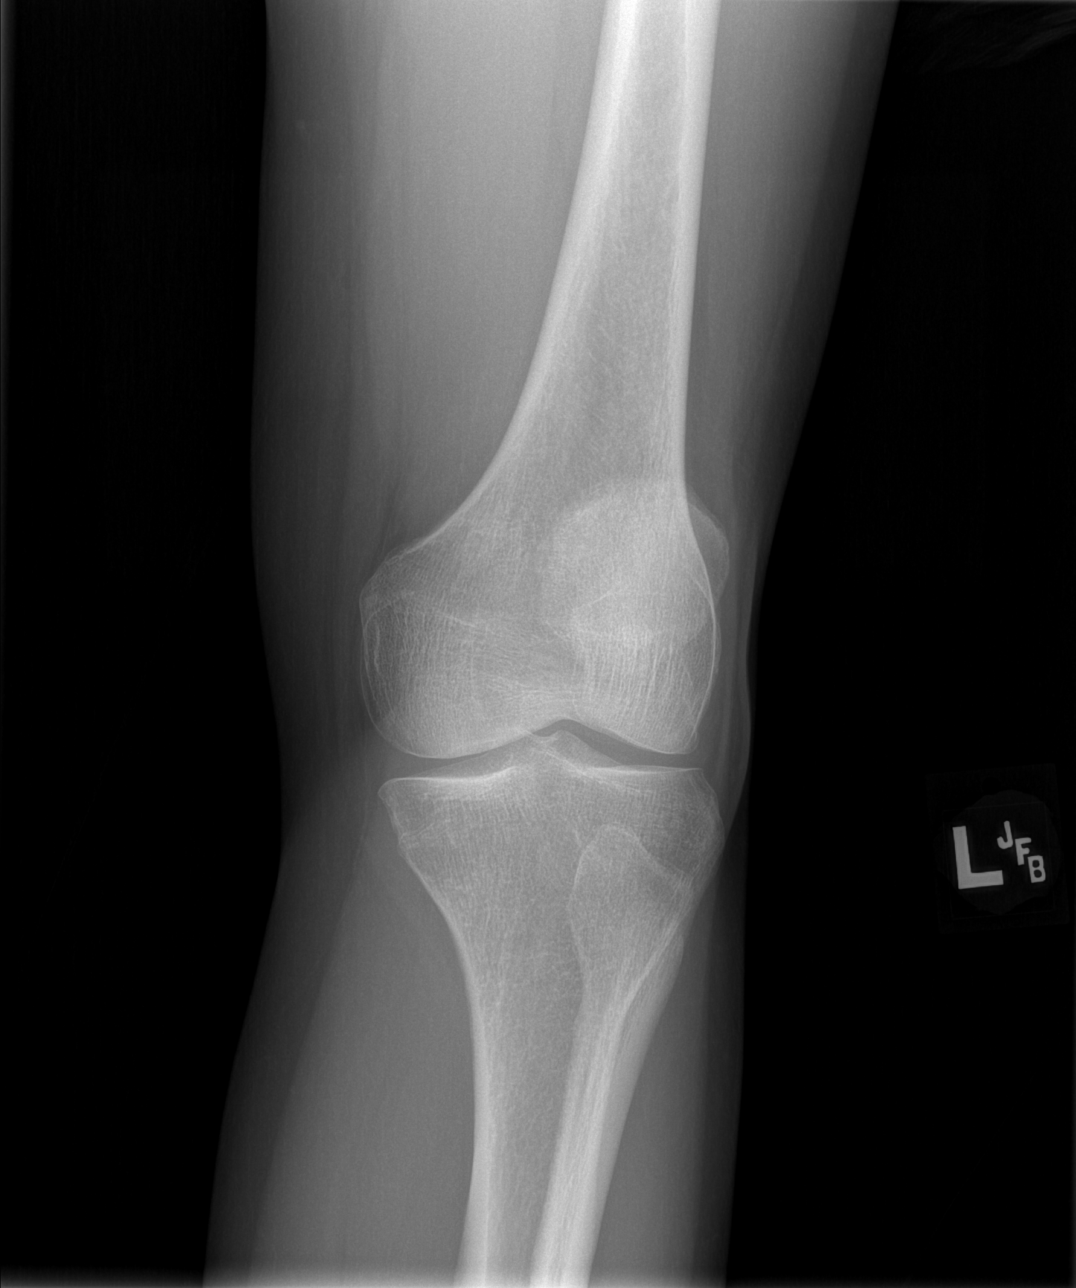

[t knee lat left]
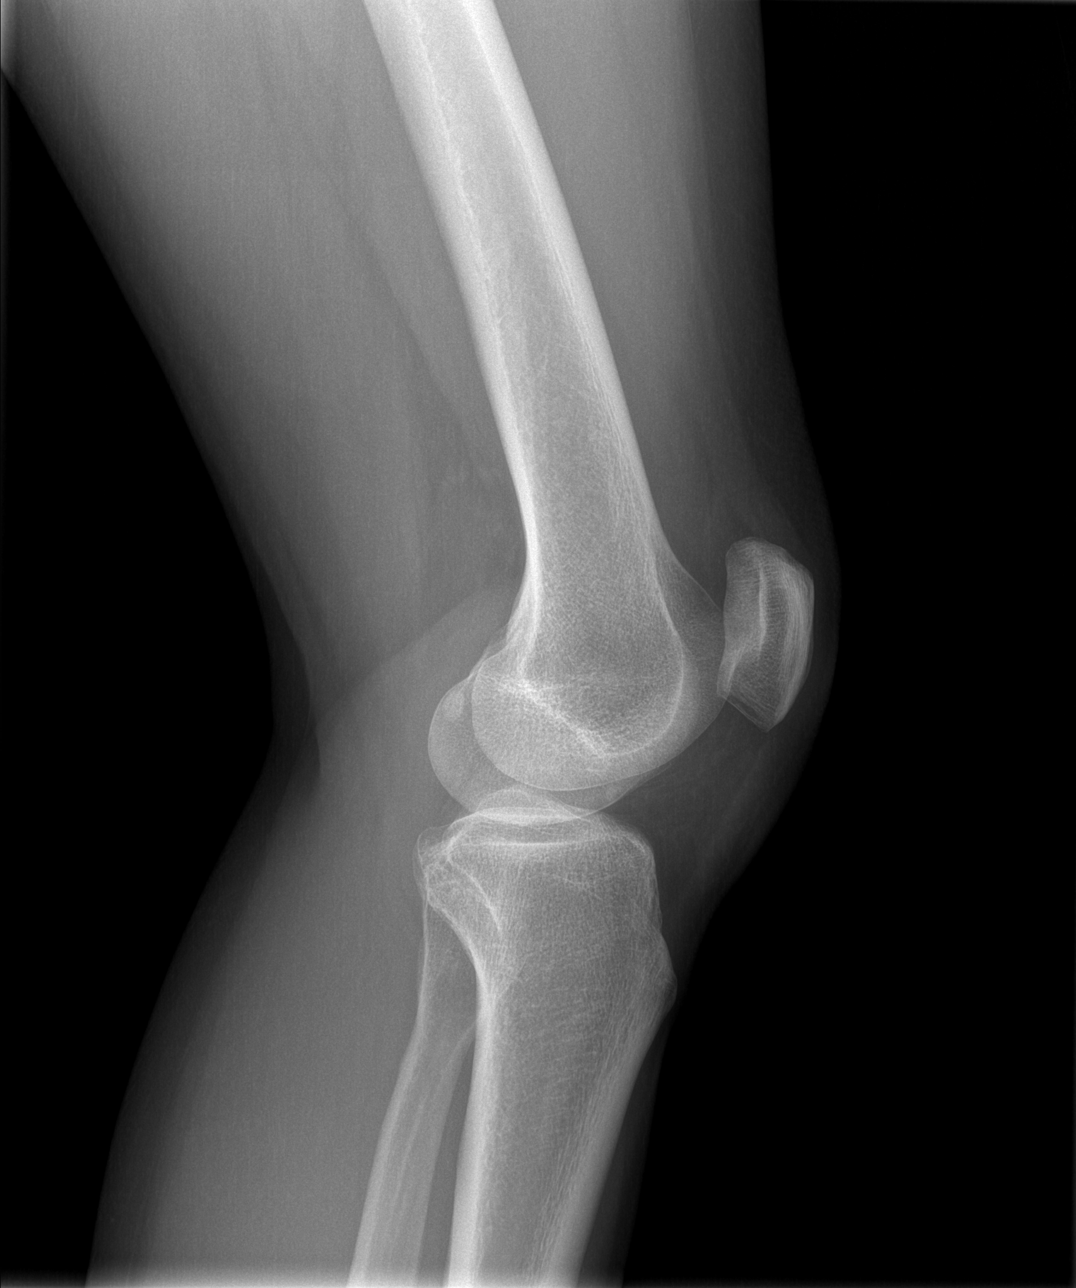

[4 of 4 positions shown; findings below may reference images not displayed]

FINDINGS: Osseous alignment is normal. Bone mineralization is normal. No
fracture line or displaced fracture fragment seen. No convincing
joint effusion and overlying soft tissues are unremarkable.
IMPRESSION: Negative.

## 2018-04-05 IMAGING — CR DG HAND COMPLETE 3+V*R*
3 series · 3 of 3 positions shown · non-contrast
Comparison: None.

CLINICAL DATA: Pt fell today while running, states she tripped on
an raised piece of cement falling forward, laceration to right
eyebrow, abrasions to left knee and right hand

EXAM:
RIGHT HAND - COMPLETE 3+ VIEW

[x hand pa right]
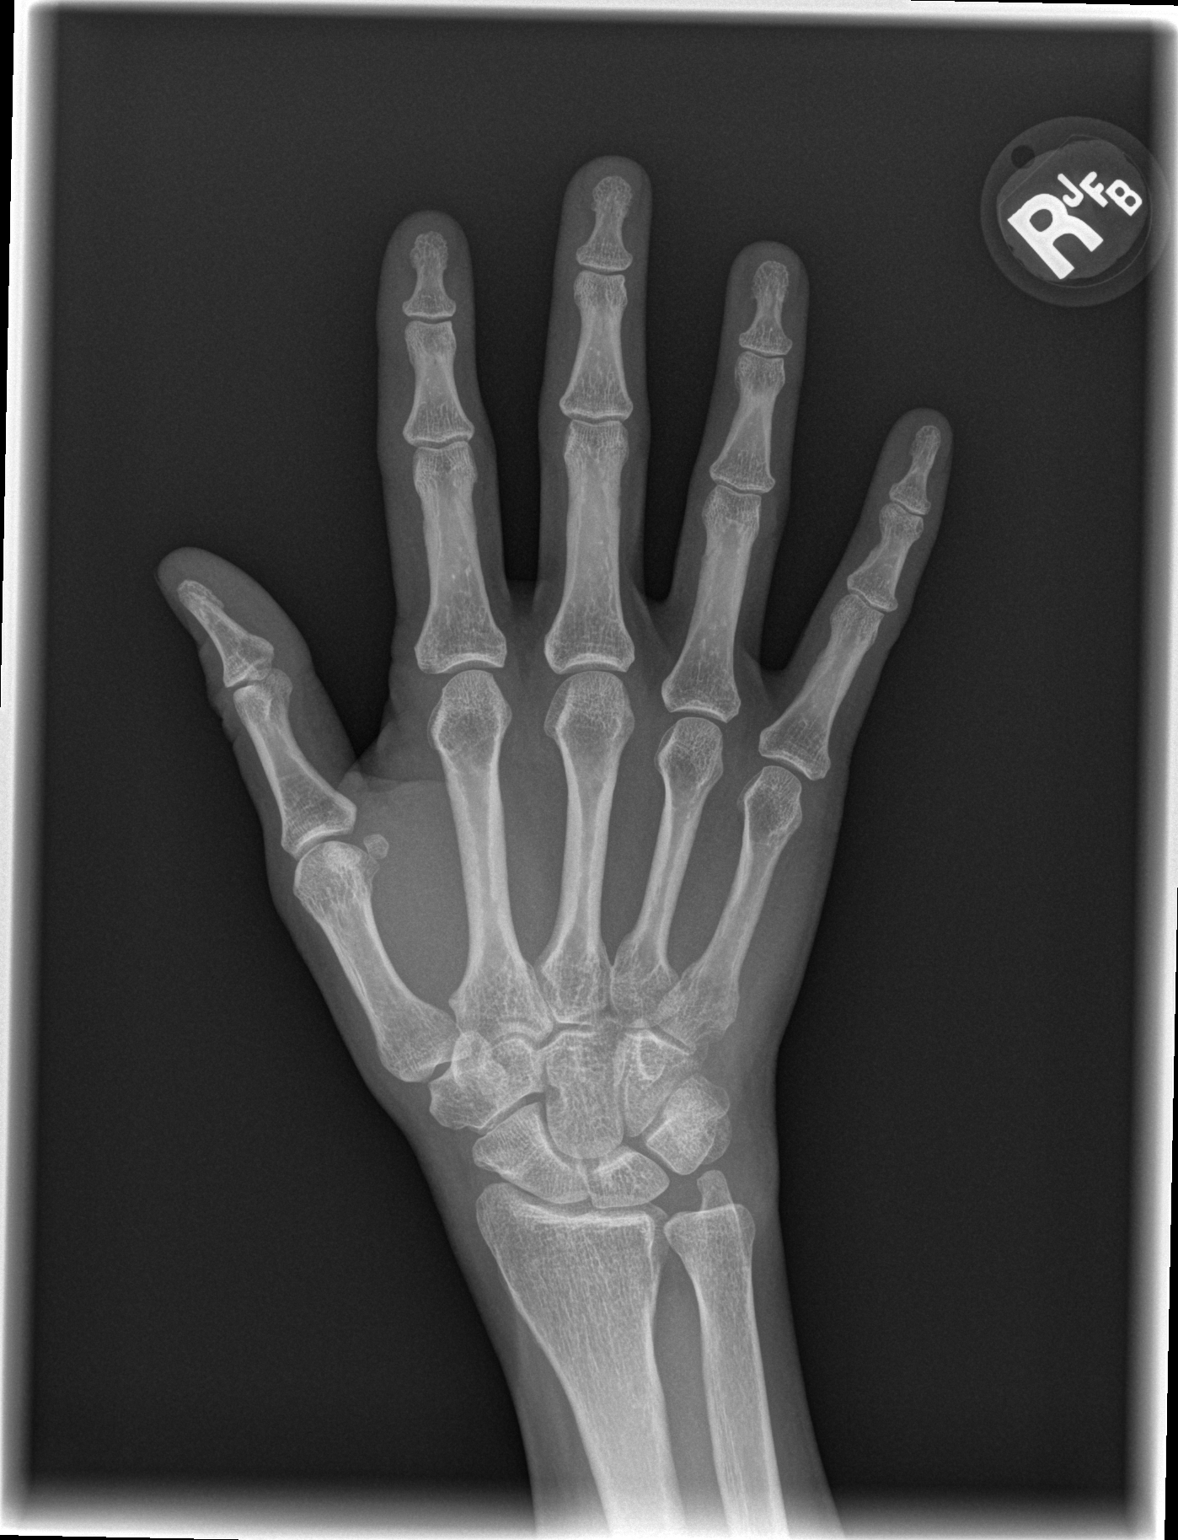

[x hand oblique right]
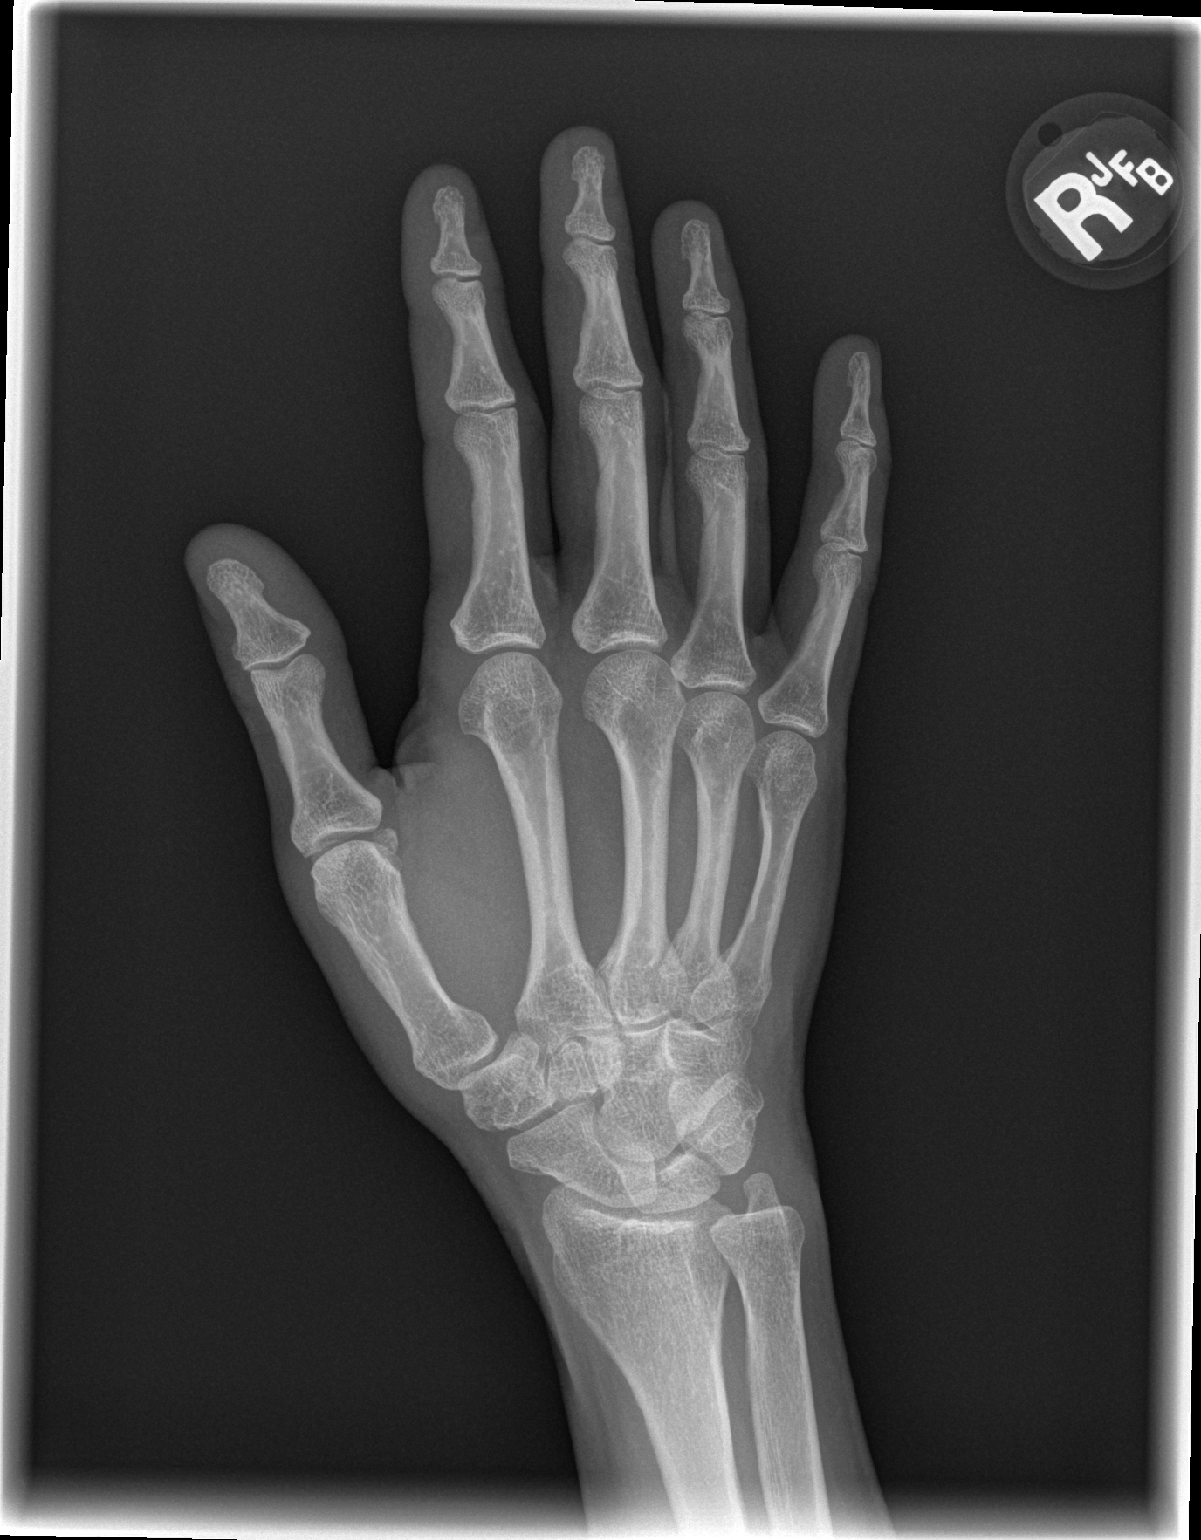

[x hand lat right]
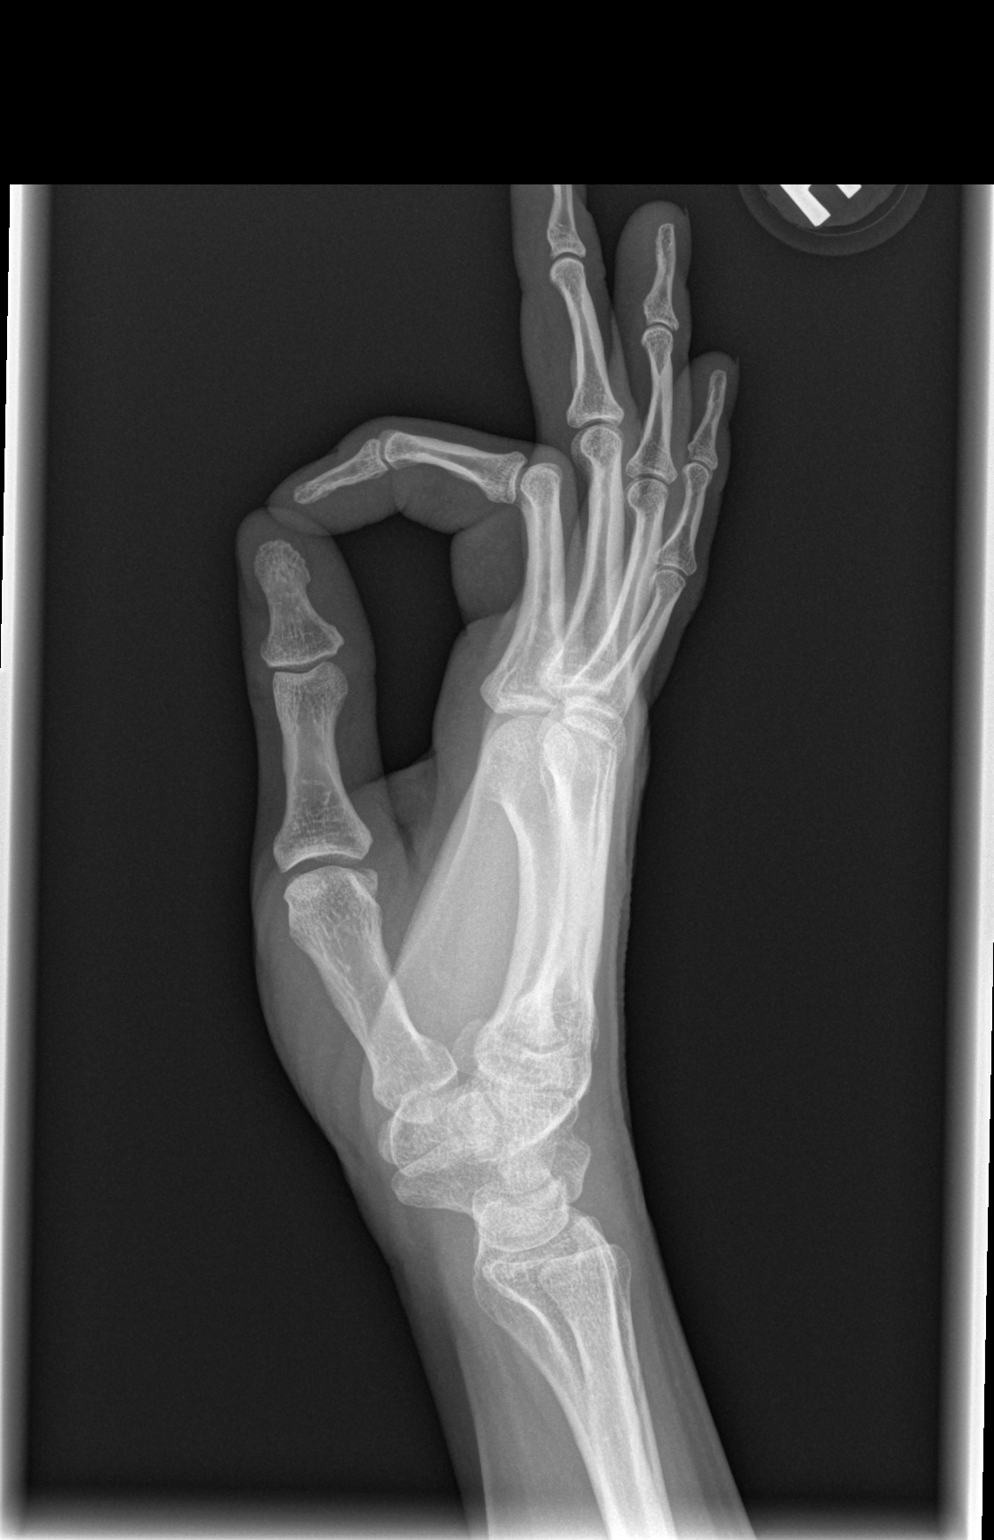

[3 of 3 positions shown; findings below may reference images not displayed]

FINDINGS: Osseous alignment is normal. No fracture line or displaced fracture
fragment. Adjacent soft tissues are unremarkable.
IMPRESSION: Negative.

## 2018-04-05 IMAGING — CT CT MAXILLOFACIAL W/O CM
4 of 6 series · 14 of 47 positions shown, 16 images · non-contrast
Comparison: None.

CLINICAL DATA: Fall running, right eyebrow laceration, left knee
abrasions.

EXAM:
CT HEAD WITHOUT CONTRAST
CT MAXILLOFACIAL WITHOUT CONTRAST
TECHNIQUE: Multidetector CT imaging of the head and maxillofacial structures
were performed using the standard protocol without intravenous
contrast. Multiplanar CT image reconstructions of the maxillofacial
structures were also generated.

[Series 2: head wo · axial · 0.47mm/px · z∈[-162,-47]mm · 7 of 31 slices shown, 9 images]
[im 4/31  brain]
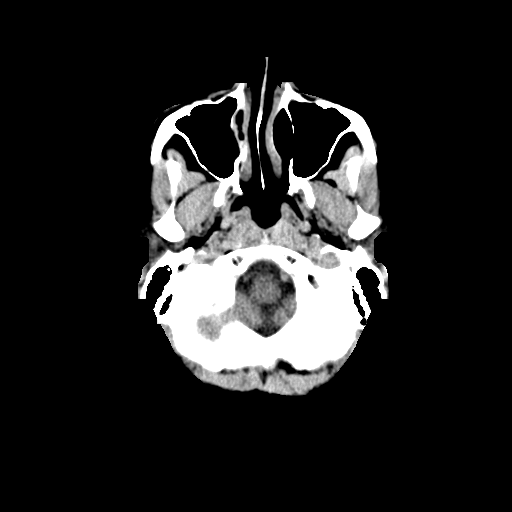
[im 4/31  bone]
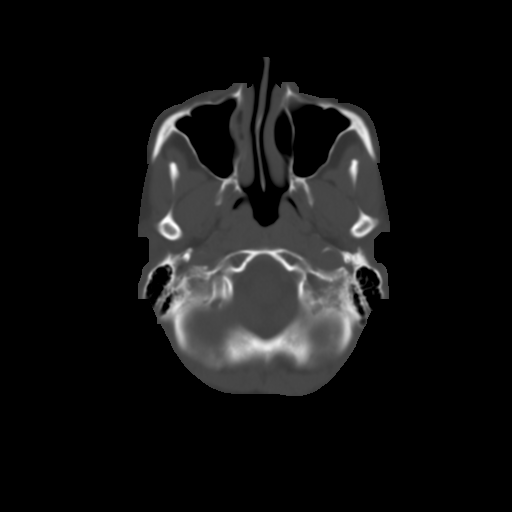
[im 8/31  bone]
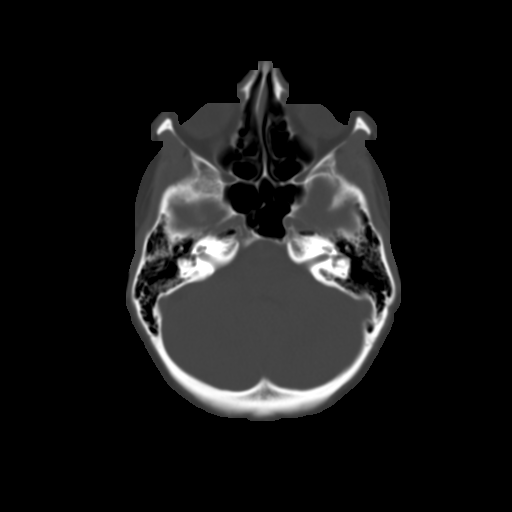
[im 12/31  bone]
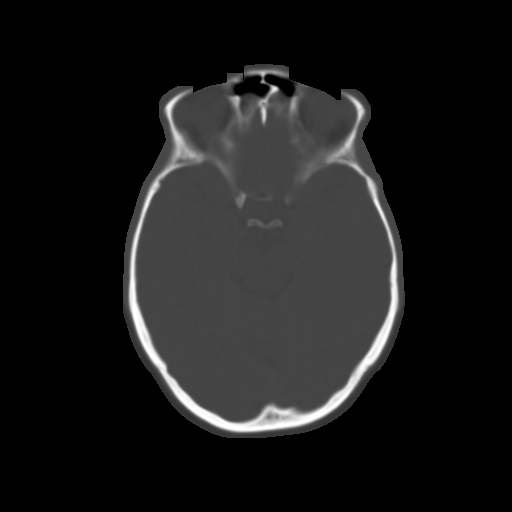
[im 16/31  bone]
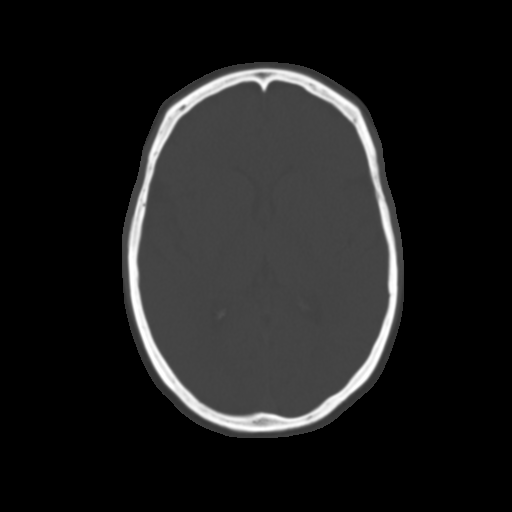
[im 19/31  brain]
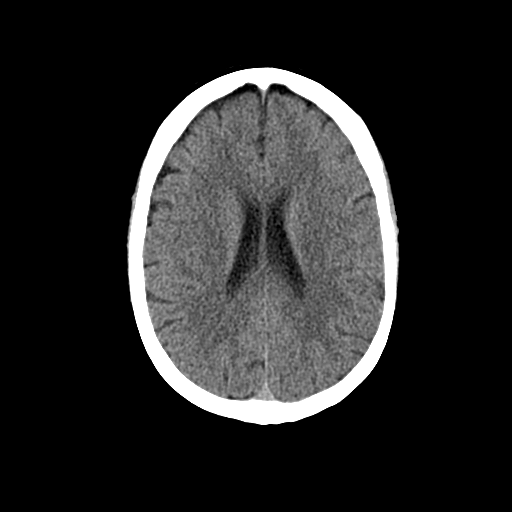
[im 19/31  bone]
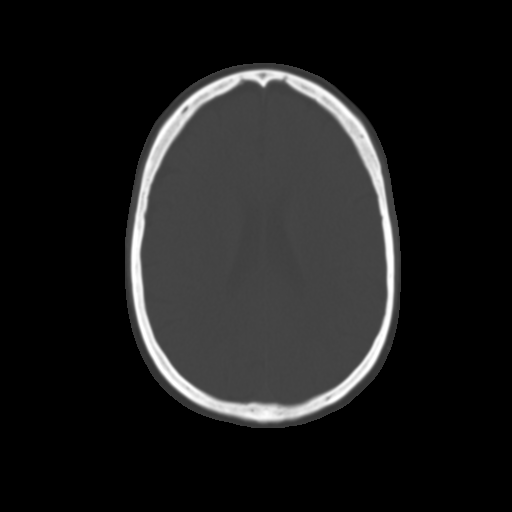
[im 23/31  bone]
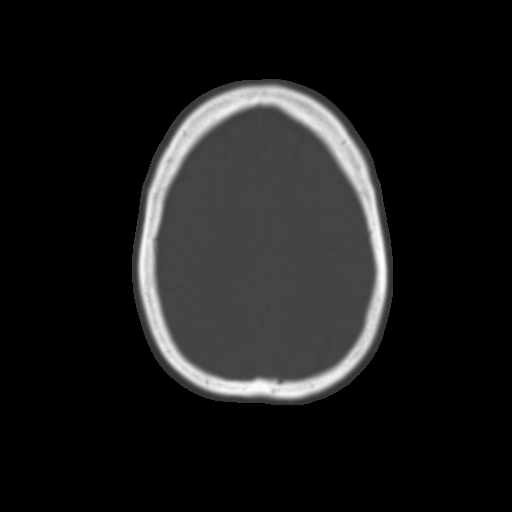
[im 27/31  bone]
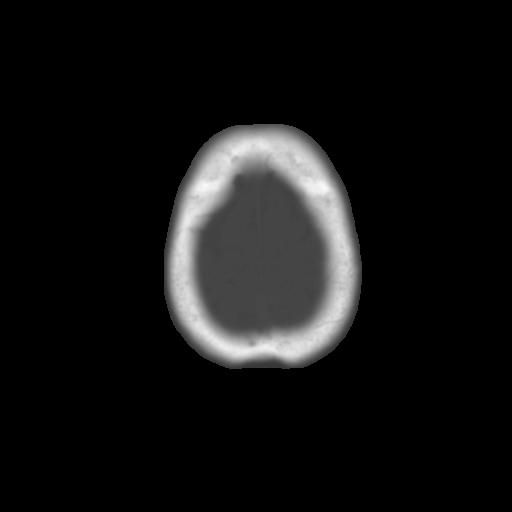

[Series 4: max soft · axial · 0.33mm/px · z∈[-231,-203]mm · 3 of 73 slices shown]
[im 8/73  brain]
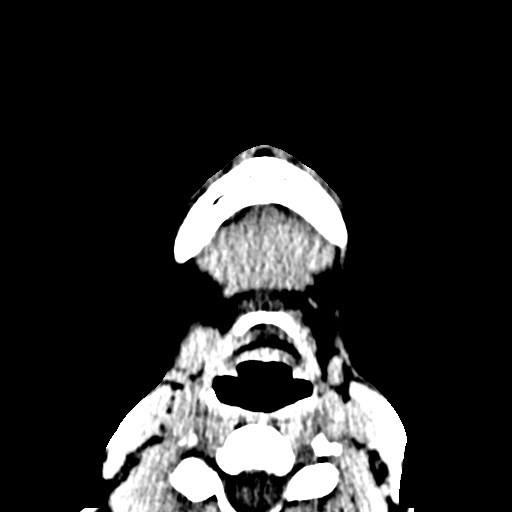
[im 15/73  brain]
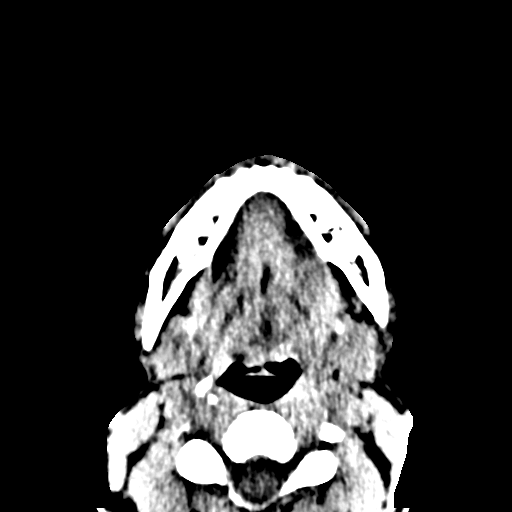
[im 22/73  brain]
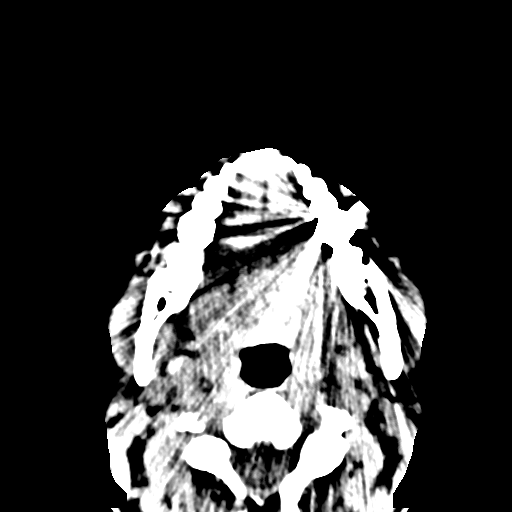

[Series 8: cor head wo · coronal · 0.29mm/px · 3 of 60 slices shown]
[im 15/60  bone]
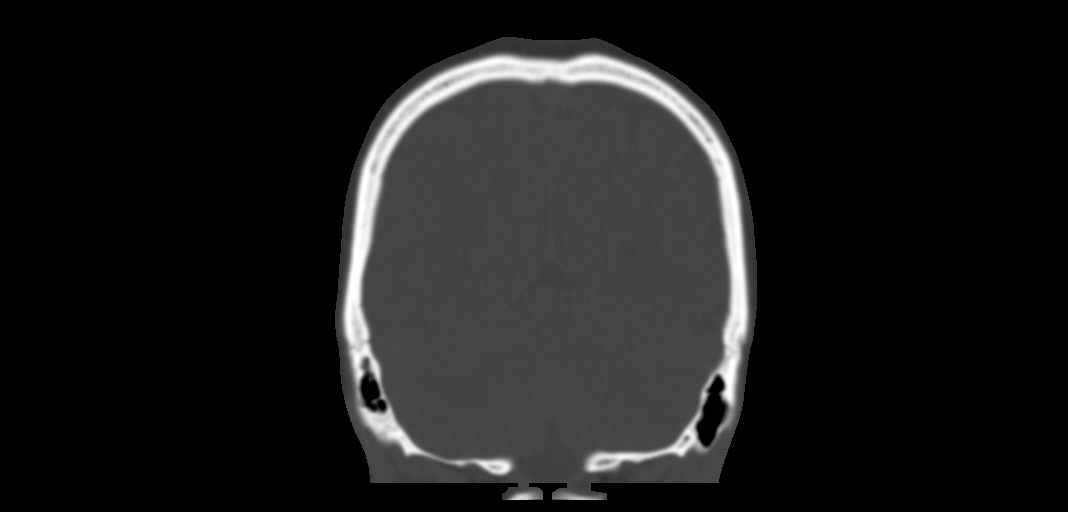
[im 30/60  bone]
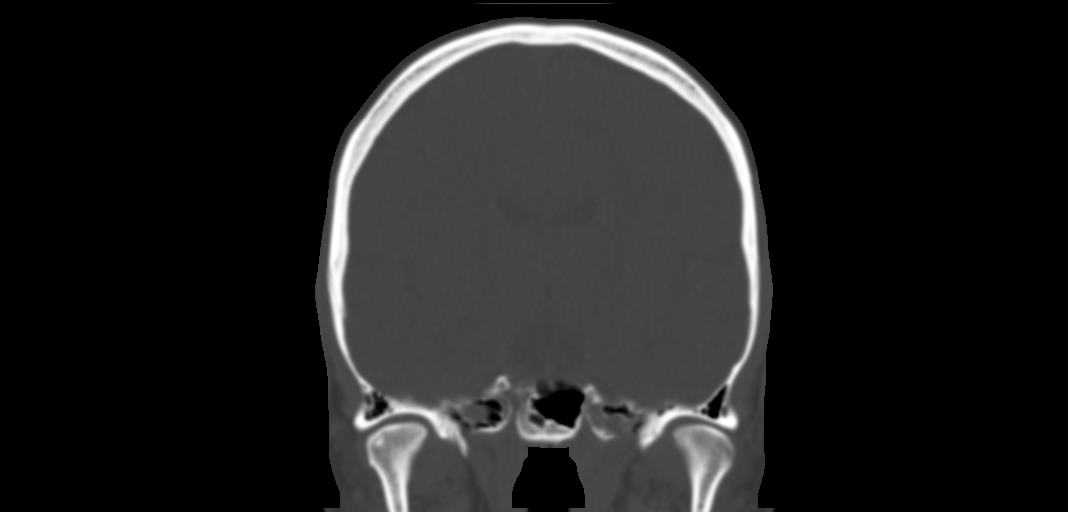
[im 45/60  bone]
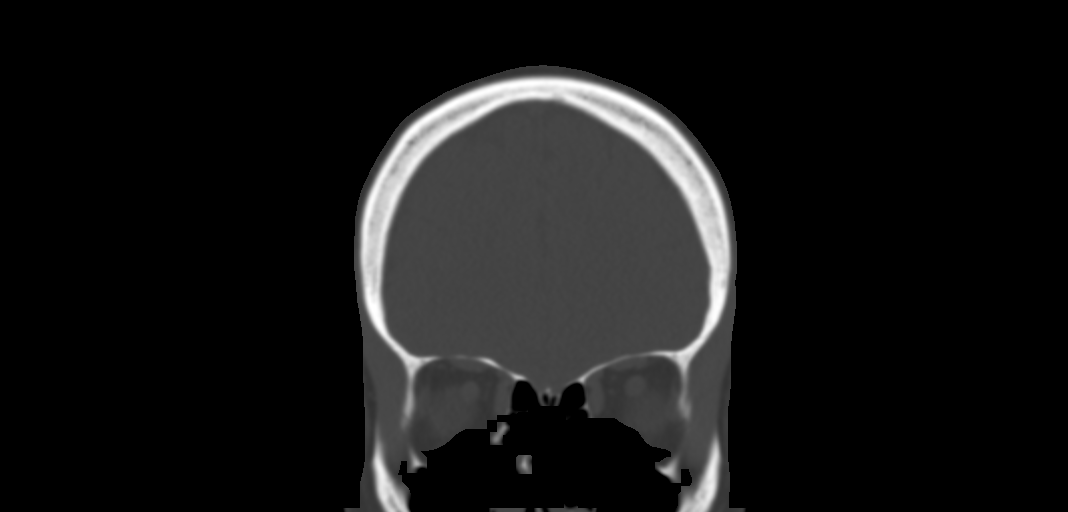

[Series 9: sag head wo · sagittal · 0.29mm/px · 1 of 52 slices shown]
[im 26/52  bone]
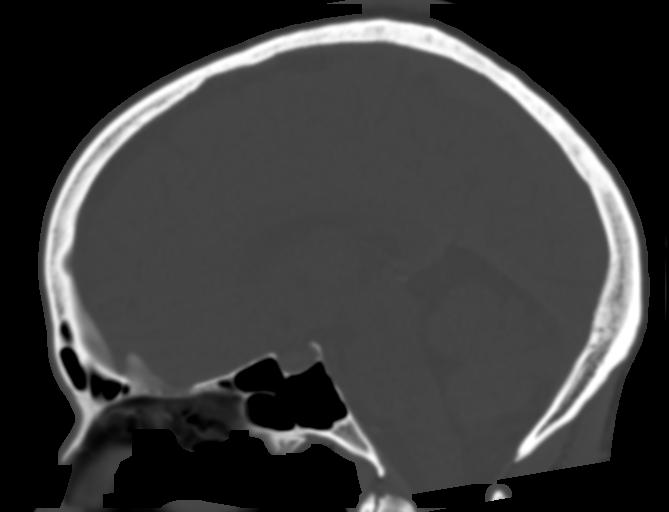

[14 of 47 positions shown; findings below may reference images not displayed]

FINDINGS: CT HEAD FINDINGS

Brain: No evidence of an acute infarct, acute hemorrhage, mass
lesion, mass effect or hydrocephalus.

Vascular: No hyperdense vessel or unexpected calcification.

Skull: No fracture.  Right rib nasal septum deviation.

Other: Clear paranasal sinuses and mastoid air cells.

CT MAXILLOFACIAL FINDINGS

Osseous: No fracture.  Right rete nasal septum deviation.

Orbits: Negative. No traumatic or inflammatory finding.

Sinuses: Clear.

Soft tissues: Right facial soft tissue swelling.
IMPRESSION: 1. No evidence of acute intracranial trauma or facial fracture.
2. Right facial soft tissue swelling.
3. Rightward nasal septum deviation.

## 2018-12-26 ENCOUNTER — Other Ambulatory Visit: Payer: Self-pay | Admitting: Women's Health

## 2018-12-26 DIAGNOSIS — Z1231 Encounter for screening mammogram for malignant neoplasm of breast: Secondary | ICD-10-CM

## 2019-01-23 ENCOUNTER — Ambulatory Visit
Admission: RE | Admit: 2019-01-23 | Discharge: 2019-01-23 | Disposition: A | Discharge status: Home / Self Care | Payer: BLUE CROSS/BLUE SHIELD | Source: Ambulatory Visit | Attending: Women's Health | Admitting: Women's Health

## 2019-01-23 DIAGNOSIS — Z1231 Encounter for screening mammogram for malignant neoplasm of breast: Secondary | ICD-10-CM | POA: Diagnosis not present

## 2019-05-15 ENCOUNTER — Ambulatory Visit (INDEPENDENT_AMBULATORY_CARE_PROVIDER_SITE_OTHER): Payer: BC Managed Care – PPO | Admitting: Women's Health

## 2019-05-15 ENCOUNTER — Other Ambulatory Visit: Payer: Self-pay

## 2019-05-15 ENCOUNTER — Encounter: Payer: Self-pay | Admitting: Women's Health

## 2019-05-15 VITALS — BP 140/80

## 2019-05-15 DIAGNOSIS — N898 Other specified noninflammatory disorders of vagina: Secondary | ICD-10-CM | POA: Diagnosis not present

## 2019-05-15 DIAGNOSIS — R300 Dysuria: Secondary | ICD-10-CM

## 2019-05-15 LAB — WET PREP FOR TRICH, YEAST, CLUE

## 2019-05-15 MED ORDER — NITROFURANTOIN MONOHYD MACRO 100 MG PO CAPS: 100.0000 mg | ORAL_CAPSULE | Freq: Two times a day (BID) | ORAL | 0 refills | Status: DC

## 2019-05-15 NOTE — Progress Notes (Signed)
48 year old MWF G3 P2 presents with complaint of increased urinary frequency, urgency, burning sensation at initiation and end of stream of urination for 1 day.  Mild vaginal irritation without visible discharge.  Denies back/abdominal pain, nausea, or fever.  Regular monthly cycles/vasectomy.  No known medical problems.  Exam: Appears well.  No CVAT.  Abdomen soft without rebound, external genitalia within normal limits, no erythema, speculum exam scant menses, no visible erythema, wet prep negative.  Bimanual no CMT or adnexal tenderness.  No suprapubic pain. UA: +2 blood, negative nitrites, +1 leukocytes, 6-10 WBCs, 3-10 RBCs, 10-20 squamous epithelials, few bacteria  Probable UTI  Plan: Macrobid twice daily for 7 days, take with food, UTI prevention discussed.  Instructed to call if continued symptoms.  Urine culture pending.

## 2019-05-15 NOTE — Patient Instructions (Signed)
Urinary Tract Infection, Adult A urinary tract infection (UTI) is an infection of any part of the urinary tract. The urinary tract includes:  The kidneys.  The ureters.  The bladder.  The urethra. These organs make, store, and get rid of pee (urine) in the body. What are the causes? This is caused by germs (bacteria) in your genital area. These germs grow and cause swelling (inflammation) of your urinary tract. What increases the risk? You are more likely to develop this condition if:  You have a small, thin tube (catheter) to drain pee.  You cannot control when you pee or poop (incontinence).  You are female, and: ? You use these methods to prevent pregnancy: ? A medicine that kills sperm (spermicide). ? A device that blocks sperm (diaphragm). ? You have low levels of a female hormone (estrogen). ? You are pregnant.  You have genes that add to your risk.  You are sexually active.  You take antibiotic medicines.  You have trouble peeing because of: ? A prostate that is bigger than normal, if you are female. ? A blockage in the part of your body that drains pee from the bladder (urethra). ? A kidney stone. ? A nerve condition that affects your bladder (neurogenic bladder). ? Not getting enough to drink. ? Not peeing often enough.  You have other conditions, such as: ? Diabetes. ? A weak disease-fighting system (immune system). ? Sickle cell disease. ? Gout. ? Injury of the spine. What are the signs or symptoms? Symptoms of this condition include:  Needing to pee right away (urgently).  Peeing often.  Peeing small amounts often.  Pain or burning when peeing.  Blood in the pee.  Pee that smells bad or not like normal.  Trouble peeing.  Pee that is cloudy.  Fluid coming from the vagina, if you are female.  Pain in the belly or lower back. Other symptoms include:  Throwing up (vomiting).  No urge to eat.  Feeling mixed up (confused).  Being tired  and grouchy (irritable).  A fever.  Watery poop (diarrhea). How is this treated? This condition may be treated with:  Antibiotic medicine.  Other medicines.  Drinking enough water. Follow these instructions at home:  Medicines  Take over-the-counter and prescription medicines only as told by your doctor.  If you were prescribed an antibiotic medicine, take it as told by your doctor. Do not stop taking it even if you start to feel better. General instructions  Make sure you: ? Pee until your bladder is empty. ? Do not hold pee for a long time. ? Empty your bladder after sex. ? Wipe from front to back after pooping if you are a female. Use each tissue one time when you wipe.  Drink enough fluid to keep your pee pale yellow.  Keep all follow-up visits as told by your doctor. This is important. Contact a doctor if:  You do not get better after 1-2 days.  Your symptoms go away and then come back. Get help right away if:  You have very bad back pain.  You have very bad pain in your lower belly.  You have a fever.  You are sick to your stomach (nauseous).  You are throwing up. Summary  A urinary tract infection (UTI) is an infection of any part of the urinary tract.  This condition is caused by germs in your genital area.  There are many risk factors for a UTI. These include having a small, thin   tube to drain pee and not being able to control when you pee or poop.  Treatment includes antibiotic medicines for germs.  Drink enough fluid to keep your pee pale yellow. This information is not intended to replace advice given to you by your health care provider. Make sure you discuss any questions you have with your health care provider. Document Released: 05/17/2008 Document Revised: 06/08/2018 Document Reviewed: 06/08/2018 Elsevier Interactive Patient Education  2019 [employer]  

## 2019-05-18 LAB — URINALYSIS, COMPLETE W/RFL CULTURE
Bilirubin Urine: NEGATIVE
Glucose, UA: NEGATIVE
Hyaline Cast: NONE SEEN /LPF
Ketones, ur: NEGATIVE
Nitrites, Initial: NEGATIVE
Protein, ur: NEGATIVE
Specific Gravity, Urine: 1.015 (ref 1.001–1.03)
pH: 7.5 (ref 5.0–8.0)

## 2019-05-18 LAB — CULTURE INDICATED

## 2019-05-18 LAB — URINE CULTURE
MICRO NUMBER:: 532607
SPECIMEN QUALITY:: ADEQUATE

## 2019-05-28 ENCOUNTER — Other Ambulatory Visit: Payer: Self-pay

## 2019-05-29 ENCOUNTER — Encounter: Payer: Self-pay | Admitting: Women's Health

## 2019-05-29 ENCOUNTER — Ambulatory Visit (INDEPENDENT_AMBULATORY_CARE_PROVIDER_SITE_OTHER): Payer: BC Managed Care – PPO | Admitting: Women's Health

## 2019-05-29 VITALS — BP 126/80 | Ht 64.0 in | Wt 124.0 lb

## 2019-05-29 DIAGNOSIS — Z01419 Encounter for gynecological examination (general) (routine) without abnormal findings: Secondary | ICD-10-CM | POA: Diagnosis not present

## 2019-05-29 MED ORDER — NITROFURANTOIN MACROCRYSTAL 50 MG PO CAPS: ORAL_CAPSULE | ORAL | 0 refills | Status: DC

## 2019-05-29 NOTE — Progress Notes (Signed)
Rolonda Pontarelli Sitzmann [birth year] [id number]    History:    Presents for annual exam.  Monthly 5-day cycle/vasectomy.  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  Normal mammogram history.  05/15/2019 UTI treated symptoms have resolved.  History of IBS and headaches.  Past medical history, past surgical history, family history and social history were all reviewed and documented in the EPIC chart.  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  ROS:  A ROS was performed and pertinent positives and negatives are included.  Exam:  Vitals:   05/29/19 1532  BP: 126/80  Weight: 124 lb (56.2 kg)  Height: 5\' 4"  (1.626 m)   Body mass index is 21.28 kg/m.   General appearance:  Normal Thyroid:  Symmetrical, normal in size, without palpable masses or nodularity. Respiratory  Auscultation:  Clear without wheezing or rhonchi Cardiovascular  Auscultation:  Regular rate, without rubs, murmurs or gallops  Edema/varicosities:  Not grossly evident Abdominal  Soft,nontender, without masses, guarding or rebound.  Liver/spleen:  No organomegaly noted  Hernia:  None appreciated  Skin  Inspection:  Grossly normal   Breasts: Examined lying and sitting.     Right: Without masses, retractions, discharge or axillary adenopathy.     Left: Without masses, retractions, discharge or axillary adenopathy. Gentitourinary   Inguinal/mons:  Normal without inguinal adenopathy  External genitalia:  Normal  BUS/Urethra/Skene's glands:  Normal  Vagina:  Normal  Cervix:  Normal  Uterus:  normal in size, shape and contour.  Midline and mobile  Adnexa/parametria:     Rt: Without masses or tenderness.   Lt: Without masses or tenderness.  Anus and perineum: Normal  Digital rectal exam: Normal sphincter tone without palpated masses or tenderness  Assessment/Plan:  48 y.o. MWF G3, P2 for annual exam with no complaints.  Monthly 5-day cycle/vasectomy  Plan: SBEs, continue annual  screening mammogram, calcium rich foods, vitamin D 1000 daily encouraged.  Macrodantin 50 mg  as needed after intercourse.  UTI prevention discussed, relates UTIs to intercourse.  Continue healthy lifestyle of regular exercise, running,.  CBC, CMP, Pap normal 2019, new screening guidelines reviewed.  Instructed to come fasting next year we will check lipid panel.    Huel Cote [facility], 3:37 PM 05/29/2019

## 2019-05-29 NOTE — Patient Instructions (Addendum)
Vit d 1000 daily  Health Maintenance, Female Adopting a healthy lifestyle and getting preventive care can go a long way to promote health and wellness. Talk with your health care provider about what schedule of regular examinations is right for you. This is a good chance for you to check in with your provider about disease prevention and staying healthy. In between checkups, there are plenty of things you can do on your own. Experts have done a lot of research about which lifestyle changes and preventive measures are most likely to keep you healthy. Ask your health care provider for more information. Weight and diet Eat a healthy diet  Be sure to include plenty of vegetables, fruits, low-fat dairy products, and lean protein.  Do not eat a lot of foods high in solid fats, added sugars, or salt.  Get regular exercise. This is one of the most important things you can do for your health. ? Most adults should exercise for at least 150 minutes each week. The exercise should increase your heart rate and make you sweat (moderate-intensity exercise). ? Most adults should also do strengthening exercises at least twice a week. This is in addition to the moderate-intensity exercise. Maintain a healthy weight  Body mass index (BMI) is a measurement that can be used to identify possible weight problems. It estimates body fat based on height and weight. Your health care provider can help determine your BMI and help you achieve or maintain a healthy weight.  For females 28 years of age and older: ? A BMI below 18.5 is considered underweight. ? A BMI of 18.5 to 24.9 is normal. ? A BMI of 25 to 29.9 is considered overweight. ? A BMI of 30 and above is considered obese. Watch levels of cholesterol and blood lipids  You should start having your blood tested for lipids and cholesterol at 48 years of age, then have this test every 5 years.  You may need to have your cholesterol levels checked more often  if: ? Your lipid or cholesterol levels are high. ? You are older than 48 years of age. ? You are at high risk for heart disease. Cancer screening Lung Cancer  Lung cancer screening is recommended for adults 48-64 years old who are at high risk for lung cancer because of a history of smoking.  A yearly low-dose CT scan of the lungs is recommended for people who: ? Currently smoke. ? Have quit within the past 15 years. ? Have at least a 30-pack-year history of smoking. A pack year is smoking an average of one pack of cigarettes a day for 1 year.  Yearly screening should continue until it has been 15 years since you quit.  Yearly screening should stop if you develop a health problem that would prevent you from having lung cancer treatment. Breast Cancer  Practice breast self-awareness. This means understanding how your breasts normally appear and feel.  It also means doing regular breast self-exams. Let your health care provider know about any changes, no matter how small.  If you are in your 20s or 30s, you should have a clinical breast exam (CBE) by a health care provider every 1-3 years as part of a regular health exam.  If you are 4 or older, have a CBE every year. Also consider having a breast X-ray (mammogram) every year.  If you have a family history of breast cancer, talk to your health care provider about genetic screening.  If you are at high  risk for breast cancer, talk to your health care provider about having an MRI and a mammogram every year.  Breast cancer gene (BRCA) assessment is recommended for women who have family members with BRCA-related cancers. BRCA-related cancers include: ? Breast. ? Ovarian. ? Tubal. ? Peritoneal cancers.  Results of the assessment will determine the need for genetic counseling and BRCA1 and BRCA2 testing. Cervical Cancer Your health care provider may recommend that you be screened regularly for cancer of the pelvic organs (ovaries,  uterus, and vagina). This screening involves a pelvic examination, including checking for microscopic changes to the surface of your cervix (Pap test). You may be encouraged to have this screening done every 3 years, beginning at age 48.  For women ages 50-65, health care providers may recommend pelvic exams and Pap testing every 3 years, or they may recommend the Pap and pelvic exam, combined with testing for human papilloma virus (HPV), every 5 years. Some types of HPV increase your risk of cervical cancer. Testing for HPV may also be done on women of any age with unclear Pap test results.  Other health care providers may not recommend any screening for nonpregnant women who are considered low risk for pelvic cancer and who do not have symptoms. Ask your health care provider if a screening pelvic exam is right for you.  If you have had past treatment for cervical cancer or a condition that could lead to cancer, you need Pap tests and screening for cancer for at least 20 years after your treatment. If Pap tests have been discontinued, your risk factors (such as having a new sexual partner) need to be reassessed to determine if screening should resume. Some women have medical problems that increase the chance of getting cervical cancer. In these cases, your health care provider may recommend more frequent screening and Pap tests. Colorectal Cancer  This type of cancer can be detected and often prevented.  Routine colorectal cancer screening usually begins at 48 years of age and continues through 48 years of age.  Your health care provider may recommend screening at an earlier age if you have risk factors for colon cancer.  Your health care provider may also recommend using home test kits to check for hidden blood in the stool.  A small camera at the end of a tube can be used to examine your colon directly (sigmoidoscopy or colonoscopy). This is done to check for the earliest forms of colorectal  cancer.  Routine screening usually begins at age 48.  Direct examination of the colon should be repeated every 5-10 years through 48 years of age. However, you may need to be screened more often if early forms of precancerous polyps or small growths are found. Skin Cancer  Check your skin from head to toe regularly.  Tell your health care provider about any new moles or changes in moles, especially if there is a change in a mole's shape or color.  Also tell your health care provider if you have a mole that is larger than the size of a pencil eraser.  Always use sunscreen. Apply sunscreen liberally and repeatedly throughout the day.  Protect yourself by wearing long sleeves, pants, a wide-brimmed hat, and sunglasses whenever you are outside. Heart disease, diabetes, and high blood pressure  High blood pressure causes heart disease and increases the risk of stroke. High blood pressure is more likely to develop in: ? People who have blood pressure in the high end of the normal range (  130-139/85-89 mm Hg). ? People who are overweight or obese. ? People who are African American.  If you are 110-28 years of age, have your blood pressure checked every 3-5 years. If you are 42 years of age or older, have your blood pressure checked every year. You should have your blood pressure measured twice-once when you are at a hospital or clinic, and once when you are not at a hospital or clinic. Record the average of the two measurements. To check your blood pressure when you are not at a hospital or clinic, you can use: ? An automated blood pressure machine at a pharmacy. ? A home blood pressure monitor.  If you are between 28 years and 6 years old, ask your health care provider if you should take aspirin to prevent strokes.  Have regular diabetes screenings. This involves taking a blood sample to check your fasting blood sugar level. ? If you are at a normal weight and have a low risk for diabetes,  have this test once every three years after 48 years of age. ? If you are overweight and have a high risk for diabetes, consider being tested at a younger age or more often. Preventing infection Hepatitis B  If you have a higher risk for hepatitis B, you should be screened for this virus. You are considered at high risk for hepatitis B if: ? You were born in a country where hepatitis B is common. Ask your health care provider which countries are considered high risk. ? Your parents were born in a high-risk country, and you have not been immunized against hepatitis B (hepatitis B vaccine). ? You have HIV or AIDS. ? You use needles to inject street drugs. ? You live with someone who has hepatitis B. ? You have had sex with someone who has hepatitis B. ? You get hemodialysis treatment. ? You take certain medicines for conditions, including cancer, organ transplantation, and autoimmune conditions. Hepatitis C  Blood testing is recommended for: ? Everyone born from [birth year] through 1965. ? Anyone with known risk factors for hepatitis C. Sexually transmitted infections (STIs)  You should be screened for sexually transmitted infections (STIs) including gonorrhea and chlamydia if: ? You are sexually active and are younger than 48 years of age. ? You are older than 48 years of age and your health care provider tells you that you are at risk for this type of infection. ? Your sexual activity has changed since you were last screened and you are at an increased risk for chlamydia or gonorrhea. Ask your health care provider if you are at risk.  If you do not have HIV, but are at risk, it may be recommended that you take a prescription medicine daily to prevent HIV infection. This is called pre-exposure prophylaxis (PrEP). You are considered at risk if: ? You are sexually active and do not regularly use condoms or know the HIV status of your partner(s). ? You take drugs by injection. ? You are sexually  active with a partner who has HIV. Talk with your health care provider about whether you are at high risk of being infected with HIV. If you choose to begin PrEP, you should first be tested for HIV. You should then be tested every 3 months for as long as you are taking PrEP. Pregnancy  If you are premenopausal and you may become pregnant, ask your health care provider about preconception counseling.  If you may become pregnant, take 400 to 800 micrograms (  mcg) of folic acid every day.  If you want to prevent pregnancy, talk to your health care provider about birth control (contraception). Osteoporosis and menopause  Osteoporosis is a disease in which the bones lose minerals and strength with aging. This can result in serious bone fractures. Your risk for osteoporosis can be identified using a bone density scan.  If you are 44 years of age or older, or if you are at risk for osteoporosis and fractures, ask your health care provider if you should be screened.  Ask your health care provider whether you should take a calcium or vitamin D supplement to lower your risk for osteoporosis.  Menopause may have certain physical symptoms and risks.  Hormone replacement therapy may reduce some of these symptoms and risks. Talk to your health care provider about whether hormone replacement therapy is right for you. Follow these instructions at home:  Schedule regular health, dental, and eye exams.  Stay current with your immunizations.  Do not use any tobacco products including cigarettes, chewing tobacco, or electronic cigarettes.  If you are pregnant, do not drink alcohol.  If you are breastfeeding, limit how much and how often you drink alcohol.  Limit alcohol intake to no more than 1 drink per day for nonpregnant women. One drink equals 12 ounces of beer, 5 ounces of wine, or 1 ounces of hard liquor.  Do not use street drugs.  Do not share needles.  Ask your health care provider for  help if you need support or information about quitting drugs.  Tell your health care provider if you often feel depressed.  Tell your health care provider if you have ever been abused or do not feel safe at home. This information is not intended to replace advice given to you by your health care provider. Make sure you discuss any questions you have with your health care provider. Document Released: 06/14/2011 Document Revised: 05/06/2016 Document Reviewed: 09/02/2015 Elsevier Interactive Patient Education  2019 [employer].

## 2019-05-30 LAB — CBC WITH DIFFERENTIAL/PLATELET
Absolute Monocytes: 745 cells/uL (ref 200–950)
Basophils Absolute: 78 cells/uL (ref 0–200)
Basophils Relative: 0.8 %
Eosinophils Absolute: 137 cells/uL (ref 15–500)
Eosinophils Relative: 1.4 %
HCT: 39.1 % (ref 35.0–45.0)
Hemoglobin: 13.6 g/dL (ref 11.7–15.5)
Lymphs Abs: 2225 cells/uL (ref 850–3900)
MCH: 31.3 pg (ref 27.0–33.0)
MCHC: 34.8 g/dL (ref 32.0–36.0)
MCV: 89.9 fL (ref 80.0–100.0)
MPV: 10.3 fL (ref 7.5–12.5)
Monocytes Relative: 7.6 %
Neutro Abs: 6615 cells/uL (ref 1500–7800)
Neutrophils Relative %: 67.5 %
Platelets: 368 10*3/uL (ref 140–400)
RBC: 4.35 10*6/uL (ref 3.80–5.10)
RDW: 12.3 % (ref 11.0–15.0)
Total Lymphocyte: 22.7 %
WBC: 9.8 10*3/uL (ref 3.8–10.8)

## 2019-05-30 LAB — COMPREHENSIVE METABOLIC PANEL
AG Ratio: 1.4 (calc) (ref 1.0–2.5)
ALT: 12 U/L (ref 6–29)
AST: 18 U/L (ref 10–35)
Albumin: 4.2 g/dL (ref 3.6–5.1)
Alkaline phosphatase (APISO): 49 U/L (ref 31–125)
BUN: 12 mg/dL (ref 7–25)
CO2: 27 mmol/L (ref 20–32)
Calcium: 9.4 mg/dL (ref 8.6–10.2)
Chloride: 101 mmol/L (ref 98–110)
Creat: 0.74 mg/dL (ref 0.50–1.10)
Globulin: 2.9 g/dL (calc) (ref 1.9–3.7)
Glucose, Bld: 82 mg/dL (ref 65–99)
Potassium: 3.9 mmol/L (ref 3.5–5.3)
Sodium: 137 mmol/L (ref 135–146)
Total Bilirubin: 0.3 mg/dL (ref 0.2–1.2)
Total Protein: 7.1 g/dL (ref 6.1–8.1)

## 2019-09-14 DIAGNOSIS — H524 Presbyopia: Secondary | ICD-10-CM | POA: Diagnosis not present

## 2019-10-05 DIAGNOSIS — Z20828 Contact with and (suspected) exposure to other viral communicable diseases: Secondary | ICD-10-CM | POA: Diagnosis not present

## 2019-11-13 DIAGNOSIS — R05 Cough: Secondary | ICD-10-CM | POA: Diagnosis not present

## 2019-11-13 DIAGNOSIS — Z03818 Encounter for observation for suspected exposure to other biological agents ruled out: Secondary | ICD-10-CM | POA: Diagnosis not present

## 2020-01-21 ENCOUNTER — Other Ambulatory Visit: Payer: Self-pay | Admitting: Women's Health

## 2020-01-21 DIAGNOSIS — Z1231 Encounter for screening mammogram for malignant neoplasm of breast: Secondary | ICD-10-CM

## 2020-02-25 ENCOUNTER — Other Ambulatory Visit: Payer: Self-pay

## 2020-02-25 ENCOUNTER — Ambulatory Visit
Admission: RE | Admit: 2020-02-25 | Discharge: 2020-02-25 | Disposition: A | Discharge status: Home / Self Care | Payer: BLUE CROSS/BLUE SHIELD | Source: Ambulatory Visit | Attending: Women's Health | Admitting: Women's Health

## 2020-02-25 DIAGNOSIS — Z1231 Encounter for screening mammogram for malignant neoplasm of breast: Secondary | ICD-10-CM | POA: Diagnosis not present

## 2020-04-14 DIAGNOSIS — M545 Low back pain: Secondary | ICD-10-CM | POA: Diagnosis not present

## 2020-04-14 DIAGNOSIS — M79601 Pain in right arm: Secondary | ICD-10-CM | POA: Diagnosis not present

## 2020-04-14 DIAGNOSIS — M7631 Iliotibial band syndrome, right leg: Secondary | ICD-10-CM | POA: Diagnosis not present

## 2020-05-29 ENCOUNTER — Other Ambulatory Visit: Payer: Self-pay

## 2020-05-29 ENCOUNTER — Encounter: Payer: Self-pay | Admitting: Nurse Practitioner

## 2020-05-29 ENCOUNTER — Ambulatory Visit (INDEPENDENT_AMBULATORY_CARE_PROVIDER_SITE_OTHER): Payer: BC Managed Care – PPO | Admitting: Nurse Practitioner

## 2020-05-29 VITALS — BP 122/80 | Ht 64.0 in | Wt 126.0 lb

## 2020-05-29 DIAGNOSIS — Z01419 Encounter for gynecological examination (general) (routine) without abnormal findings: Secondary | ICD-10-CM

## 2020-05-29 NOTE — Patient Instructions (Signed)
Menopause °Menopause is the normal time of life when menstrual periods stop completely. It is usually confirmed by 12 months without a menstrual period. The transition to menopause (perimenopause) most often happens between the ages of 45 and 55. During perimenopause, hormone levels change in your body, which can cause symptoms and affect your health. Menopause may increase your risk for: °· Loss of bone (osteoporosis), which causes bone breaks (fractures). °· Depression. °· Hardening and narrowing of the arteries (atherosclerosis), which can cause heart attacks and strokes. °What are the causes? °This condition is usually caused by a natural change in hormone levels that happens as you get older. The condition may also be caused by surgery to remove both ovaries (bilateral oophorectomy). °What increases the risk? °This condition is more likely to start at an earlier age if you have certain medical conditions or treatments, including: °· A tumor of the pituitary gland in the brain. °· A disease that affects the ovaries and hormone production. °· Radiation treatment for cancer. °· Certain cancer treatments, such as chemotherapy or hormone (anti-estrogen) therapy. °· Heavy smoking and excessive alcohol use. °· Family history of early menopause. °This condition is also more likely to develop earlier in women who are very thin. °What are the signs or symptoms? °Symptoms of this condition include: °· Hot flashes. °· Irregular menstrual periods. °· Night sweats. °· Changes in feelings about sex. This could be a decrease in sex drive or an increased comfort around your sexuality. °· Vaginal dryness and thinning of the vaginal walls. This may cause painful intercourse. °· Dryness of the skin and development of wrinkles. °· Headaches. °· Problems sleeping (insomnia). °· Mood swings or irritability. °· Memory problems. °· Weight gain. °· Hair growth on the face and chest. °· Bladder infections or problems with urinating. °How  is this diagnosed? °This condition is diagnosed based on your medical history, a physical exam, your age, your menstrual history, and your symptoms. Hormone tests may also be done. °How is this treated? °In some cases, no treatment is needed. You and your health care provider should make a decision together about whether treatment is necessary. Treatment will be based on your individual condition and preferences. Treatment for this condition focuses on managing symptoms. Treatment may include: °· Menopausal hormone therapy (MHT). °· Medicines to treat specific symptoms or complications. °· Acupuncture. °· Vitamin or herbal supplements. °Before starting treatment, make sure to let your health care provider know if you have a personal or family history of: °· Heart disease. °· Breast cancer. °· Blood clots. °· Diabetes. °· Osteoporosis. °Follow these instructions at home: °Lifestyle °· Do not use any products that contain nicotine or tobacco, such as cigarettes and e-cigarettes. If you need help quitting, ask your health care provider. °· Get at least 30 minutes of physical activity on 5 or more days each week. °· Avoid alcoholic and caffeinated beverages, as well as spicy foods. This may help prevent hot flashes. °· Get 7-8 hours of sleep each night. °· If you have hot flashes, try: °? Dressing in layers. °? Avoiding things that may trigger hot flashes, such as spicy food, warm places, or stress. °? Taking slow, deep breaths when a hot flash starts. °? Keeping a fan in your home and office. °· Find ways to manage stress, such as deep breathing, meditation, or journaling. °· Consider going to group therapy with other women who are having menopause symptoms. Ask your health care provider about recommended group therapy meetings. °Eating and   drinking °· Eat a healthy, balanced diet that contains whole grains, lean protein, low-fat dairy, and plenty of fruits and vegetables. °· Your health care provider may recommend  adding more soy to your diet. Foods that contain soy include tofu, tempeh, and soy milk. °· Eat plenty of foods that contain calcium and vitamin D for bone health. Items that are rich in calcium include low-fat milk, yogurt, beans, almonds, sardines, broccoli, and kale. °Medicines °· Take over-the-counter and prescription medicines only as told by your health care provider. °· Talk with your health care provider before starting any herbal supplements. If prescribed, take vitamins and supplements as told by your health care provider. These may include: °? Calcium. Women age 51 and older should get 1,200 mg (milligrams) of calcium every day. °? Vitamin D. Women need 600-800 International Units of vitamin D each day. °? Vitamins B12 and B6. Aim for 50 micrograms of B12 and 1.5 mg of B6 each day. °General instructions °· Keep track of your menstrual periods, including: °? When they occur. °? How heavy they are and how long they last. °? How much time passes between periods. °· Keep track of your symptoms, noting when they start, how often you have them, and how long they last. °· Use vaginal lubricants or moisturizers to help with vaginal dryness and improve comfort during sex. °· Keep all follow-up visits as told by your health care provider. This is important. This includes any group therapy or counseling. °Contact a health care provider if: °· You are still having menstrual periods after age 55. °· You have pain during sex. °· You have not had a period for 12 months and you develop vaginal bleeding. °Get help right away if: °· You have: °? Severe depression. °? Excessive vaginal bleeding. °? Pain when you urinate. °? A fast or irregular heart beat (palpitations). °? Severe headaches. °? Abdomen (abdominal) pain or severe indigestion. °· You fell and you think you have a broken bone. °· You develop leg or chest pain. °· You develop vision problems. °· You feel a lump in your breast. °Summary °· Menopause is the normal  time of life when menstrual periods stop completely. It is usually confirmed by 12 months without a menstrual period. °· The transition to menopause (perimenopause) most often happens between the ages of 45 and 55. °· Symptoms can be managed through medicines, lifestyle changes, and complementary therapies such as acupuncture. °· Eat a balanced diet that is rich in nutrients to promote bone health and heart health and to manage symptoms during menopause. °This information is not intended to replace advice given to you by your health care provider. Make sure you discuss any questions you have with your health care provider. °Document Revised: 11/11/2017 Document Reviewed: 01/01/2017 °Elsevier Patient Education © 2020 [employer] °Health Maintenance, Female °Adopting a healthy lifestyle and getting preventive care are important in promoting health and wellness. Ask your health care provider about: °· The right schedule for you to have regular tests and exams. °· Things you can do on your own to prevent diseases and keep yourself healthy. °What should I know about diet, weight, and exercise? °Eat a healthy diet ° °· Eat a diet that includes plenty of vegetables, fruits, low-fat dairy products, and lean protein. °· Do not eat a lot of foods that are high in solid fats, added sugars, or sodium. °Maintain a healthy weight °Body mass index (BMI) is used to identify weight problems. It estimates body fat based   on height and weight. Your health care provider can help determine your BMI and help you achieve or maintain a healthy weight. °Get regular exercise °Get regular exercise. This is one of the most important things you can do for your health. Most adults should: °· Exercise for at least 150 minutes each week. The exercise should increase your heart rate and make you sweat (moderate-intensity exercise). °· Do strengthening exercises at least twice a week. This is in addition to the moderate-intensity exercise. °· Spend  less time sitting. Even light physical activity can be beneficial. °Watch cholesterol and blood lipids °Have your blood tested for lipids and cholesterol at 49 years of age, then have this test every 5 years. °Have your cholesterol levels checked more often if: °· Your lipid or cholesterol levels are high. °· You are older than 49 years of age. °· You are at high risk for heart disease. °What should I know about cancer screening? °Depending on your health history and family history, you may need to have cancer screening at various ages. This may include screening for: °· Breast cancer. °· Cervical cancer. °· Colorectal cancer. °· Skin cancer. °· Lung cancer. °What should I know about heart disease, diabetes, and high blood pressure? °Blood pressure and heart disease °· High blood pressure causes heart disease and increases the risk of stroke. This is more likely to develop in people who have high blood pressure readings, are of African descent, or are overweight. °· Have your blood pressure checked: °? Every 3-5 years if you are 18-39 years of age. °? Every year if you are 40 years old or older. °Diabetes °Have regular diabetes screenings. This checks your fasting blood sugar level. Have the screening done: °· Once every three years after age 40 if you are at a normal weight and have a low risk for diabetes. °· More often and at a younger age if you are overweight or have a high risk for diabetes. °What should I know about preventing infection? °Hepatitis B °If you have a higher risk for hepatitis B, you should be screened for this virus. Talk with your health care provider to find out if you are at risk for hepatitis B infection. °Hepatitis C °Testing is recommended for: °· Everyone born from 1945 through 1965. °· Anyone with known risk factors for hepatitis C. °Sexually transmitted infections (STIs) °· Get screened for STIs, including gonorrhea and chlamydia, if: °? You are sexually active and are younger than 49  years of age. °? You are older than 49 years of age and your health care provider tells you that you are at risk for this type of infection. °? Your sexual activity has changed since you were last screened, and you are at increased risk for chlamydia or gonorrhea. Ask your health care provider if you are at risk. °· Ask your health care provider about whether you are at high risk for HIV. Your health care provider may recommend a prescription medicine to help prevent HIV infection. If you choose to take medicine to prevent HIV, you should first get tested for HIV. You should then be tested every 3 months for as long as you are taking the medicine. °Pregnancy °· If you are about to stop having your period (premenopausal) and you may become pregnant, seek counseling before you get pregnant. °· Take 400 to 800 micrograms (mcg) of folic acid every day if you become pregnant. °· Ask for birth control (contraception) if you want to prevent pregnancy. °  Osteoporosis and menopause °Osteoporosis is a disease in which the bones lose minerals and strength with aging. This can result in bone fractures. If you are 65 years old or older, or if you are at risk for osteoporosis and fractures, ask your health care provider if you should: °· Be screened for bone loss. °· Take a calcium or vitamin D supplement to lower your risk of fractures. °· Be given hormone replacement therapy (HRT) to treat symptoms of menopause. °Follow these instructions at home: °Lifestyle °· Do not use any products that contain nicotine or tobacco, such as cigarettes, e-cigarettes, and chewing tobacco. If you need help quitting, ask your health care provider. °· Do not use street drugs. °· Do not share needles. °· Ask your health care provider for help if you need support or information about quitting drugs. °Alcohol use °· Do not drink alcohol if: °? Your health care provider tells you not to drink. °? You are pregnant, may be pregnant, or are planning to  become pregnant. °· If you drink alcohol: °? Limit how much you use to 0-1 drink a day. °? Limit intake if you are breastfeeding. °· Be aware of how much alcohol is in your drink. In the [place] one drink equals one 12 oz bottle of beer (355 mL), one 5 oz glass of wine (148 mL), or one 1½ oz glass of hard liquor (44 mL). °General instructions °· Schedule regular health, dental, and eye exams. °· Stay current with your vaccines. °· Tell your health care provider if: °? You often feel depressed. °? You have ever been abused or do not feel safe at home. °Summary °· Adopting a healthy lifestyle and getting preventive care are important in promoting health and wellness. °· Follow your health care provider's instructions about healthy diet, exercising, and getting tested or screened for diseases. °· Follow your health care provider's instructions on monitoring your cholesterol and blood pressure. °This information is not intended to replace advice given to you by your health care provider. Make sure you discuss any questions you have with your health care provider. °Document Revised: 11/22/2018 Document Reviewed: 11/22/2018 °Elsevier Patient Education © 2020 Elsevier Inc. ° °

## 2020-05-29 NOTE — Progress Notes (Signed)
   Christina Hicks 10/02/1971 [id number]   History:  49 y.o. G3 P2 presents for annual exam without GYN complaints.  Monthly cycle/vasectomy.  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  Gynecologic History LMP 05/22/2020 Contraception: vasectomy Last Pap: 12/16/2017. Results were: normal Last mammogram: 02/26/2020. Results were: normal   Past medical history, past surgical history, family history and social history were all reviewed and documented in the EPIC chart.  Engineer.  ROS:  A ROS was performed and pertinent positives and negatives are included.  Exam:  Vitals:   05/29/20 1449  BP: 122/80  Weight: 126 lb (57.2 kg)  Height: 5\' 4"  (1.626 m)   Body mass index is 21.63 kg/m.  General appearance:  Normal Thyroid:  Symmetrical, normal in size, without palpable masses or nodularity. Respiratory  Auscultation:  Clear without wheezing or rhonchi Cardiovascular  Auscultation:  Regular rate, without rubs, murmurs or gallops  Edema/varicosities:  Not grossly evident Abdominal  Soft,nontender, without masses, guarding or rebound.  Liver/spleen:  No organomegaly noted  Hernia:  None appreciated  Skin  Inspection:  Grossly normal   Breasts: Examined lying and sitting.   Right: Without masses, retractions, discharge or axillary adenopathy.   Left: Without masses, retractions, discharge or axillary adenopathy. Gentitourinary   Inguinal/mons:  Normal without inguinal adenopathy  External genitalia:  Normal  BUS/Urethra/Skene's glands:  Normal  Vagina:  Normal  Cervix:  Normal  Uterus:  Anteverted, normal in size, shape and contour.  Midline and mobile  Adnexa/parametria:     Rt: Without masses or tenderness.   Lt: Without masses or tenderness.  Anus and perineum: Normal   Assessment/Plan:  49 y.o. G3 P2 for annual exam.   Well female exam with routine gynecological exam - Plan: CBC with Differential/Platelet,  Comprehensive metabolic panel. Education provided on SBEs, importance of preventative screenings, current guidelines, high calcium diet, regular exercise, and multivitamin daily.    Follow up in 1 year for annual    [facility], 3:13 PM 05/29/2020

## 2020-05-30 LAB — COMPREHENSIVE METABOLIC PANEL
AG Ratio: 1.6 (calc) (ref 1.0–2.5)
ALT: 15 U/L (ref 6–29)
AST: 18 U/L (ref 10–35)
Albumin: 4.4 g/dL (ref 3.6–5.1)
Alkaline phosphatase (APISO): 46 U/L (ref 31–125)
BUN: 11 mg/dL (ref 7–25)
CO2: 29 mmol/L (ref 20–32)
Calcium: 9.3 mg/dL (ref 8.6–10.2)
Chloride: 102 mmol/L (ref 98–110)
Creat: 0.73 mg/dL (ref 0.50–1.10)
Globulin: 2.7 g/dL (calc) (ref 1.9–3.7)
Glucose, Bld: 78 mg/dL (ref 65–99)
Potassium: 4 mmol/L (ref 3.5–5.3)
Sodium: 138 mmol/L (ref 135–146)
Total Bilirubin: 0.4 mg/dL (ref 0.2–1.2)
Total Protein: 7.1 g/dL (ref 6.1–8.1)

## 2020-05-30 LAB — CBC WITH DIFFERENTIAL/PLATELET
Absolute Monocytes: 781 cells/uL (ref 200–950)
Basophils Absolute: 59 cells/uL (ref 0–200)
Basophils Relative: 0.7 %
Eosinophils Absolute: 92 cells/uL (ref 15–500)
Eosinophils Relative: 1.1 %
HCT: 40.8 % (ref 35.0–45.0)
Hemoglobin: 13.8 g/dL (ref 11.7–15.5)
Lymphs Abs: 2226 cells/uL (ref 850–3900)
MCH: 30.7 pg (ref 27.0–33.0)
MCHC: 33.8 g/dL (ref 32.0–36.0)
MCV: 90.9 fL (ref 80.0–100.0)
MPV: 9.7 fL (ref 7.5–12.5)
Monocytes Relative: 9.3 %
Neutro Abs: 5242 cells/uL (ref 1500–7800)
Neutrophils Relative %: 62.4 %
Platelets: 368 10*3/uL (ref 140–400)
RBC: 4.49 10*6/uL (ref 3.80–5.10)
RDW: 12.7 % (ref 11.0–15.0)
Total Lymphocyte: 26.5 %
WBC: 8.4 10*3/uL (ref 3.8–10.8)

## 2020-10-06 DIAGNOSIS — M5116 Intervertebral disc disorders with radiculopathy, lumbar region: Secondary | ICD-10-CM | POA: Diagnosis not present

## 2020-10-08 DIAGNOSIS — M5116 Intervertebral disc disorders with radiculopathy, lumbar region: Secondary | ICD-10-CM | POA: Diagnosis not present

## 2020-10-13 DIAGNOSIS — M5116 Intervertebral disc disorders with radiculopathy, lumbar region: Secondary | ICD-10-CM | POA: Diagnosis not present

## 2020-10-20 DIAGNOSIS — M5116 Intervertebral disc disorders with radiculopathy, lumbar region: Secondary | ICD-10-CM | POA: Diagnosis not present

## 2020-10-31 DIAGNOSIS — M5116 Intervertebral disc disorders with radiculopathy, lumbar region: Secondary | ICD-10-CM | POA: Diagnosis not present

## 2020-11-11 DIAGNOSIS — M5116 Intervertebral disc disorders with radiculopathy, lumbar region: Secondary | ICD-10-CM | POA: Diagnosis not present

## 2021-03-19 ENCOUNTER — Other Ambulatory Visit: Payer: Self-pay | Admitting: Nurse Practitioner

## 2021-03-19 DIAGNOSIS — Z1231 Encounter for screening mammogram for malignant neoplasm of breast: Secondary | ICD-10-CM

## 2021-03-20 ENCOUNTER — Other Ambulatory Visit: Payer: Self-pay

## 2021-03-20 ENCOUNTER — Ambulatory Visit
Admission: RE | Admit: 2021-03-20 | Discharge: 2021-03-20 | Disposition: A | Discharge status: Home / Self Care | Payer: BC Managed Care – PPO | Source: Ambulatory Visit | Attending: Nurse Practitioner | Admitting: Nurse Practitioner

## 2021-03-20 DIAGNOSIS — Z1231 Encounter for screening mammogram for malignant neoplasm of breast: Secondary | ICD-10-CM

## 2021-06-01 ENCOUNTER — Other Ambulatory Visit: Payer: Self-pay

## 2021-06-01 ENCOUNTER — Encounter: Payer: Self-pay | Admitting: Nurse Practitioner

## 2021-06-01 ENCOUNTER — Ambulatory Visit (INDEPENDENT_AMBULATORY_CARE_PROVIDER_SITE_OTHER): Payer: No Typology Code available for payment source | Admitting: Nurse Practitioner

## 2021-06-01 VITALS — BP 122/68 | HR 61 | Ht 64.5 in | Wt 126.0 lb

## 2021-06-01 DIAGNOSIS — Z01419 Encounter for gynecological examination (general) (routine) without abnormal findings: Secondary | ICD-10-CM

## 2021-06-01 NOTE — Progress Notes (Signed)
   Christina Hicks 11/04/1971 [id number]   History:  50 y.o. [id number] presents for annual exam without GYN complaints. Monthly cycles (skipped one cycle in April). Husband has vasectomy. 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 Normal mammogram history.  Gynecologic History Patient's last menstrual period was 05/23/2021.   Contraception/Family planning: vasectomy  Health Maintenance Last Pap: 12/26/2017. Results were: Normal Last mammogram: 03/20/2021. Results were: Normal Last colonoscopy: Never Last Dexa: Not indicated  Past medical history, past surgical history, family history and social history were all reviewed and documented in the EPIC chart. Married. 27 yo son at [facility], 69 yo son.   ROS:  A ROS was performed and pertinent positives and negatives are included.  Exam:  Vitals:   06/01/21 1335  BP: 122/68  Pulse: 61  SpO2: 99%  Weight: 126 lb (57.2 kg)  Height: 5' 4.5" (1.638 m)   Body mass index is 21.29 kg/m.  General appearance:  Normal Thyroid:  Symmetrical, normal in size, without palpable masses or nodularity. Respiratory  Auscultation:  Clear without wheezing or rhonchi Cardiovascular  Auscultation:  Regular rate, without rubs, murmurs or gallops  Edema/varicosities:  Not grossly evident Abdominal  Soft,nontender, without masses, guarding or rebound.  Liver/spleen:  No organomegaly noted  Hernia:  None appreciated  Skin  Inspection:  Grossly normal Breasts: Examined lying and sitting.   Right: Without masses, retractions, nipple discharge or axillary adenopathy.   Left: Without masses, retractions, nipple discharge or axillary adenopathy. Genitourinary   Inguinal/mons:  Normal without inguinal adenopathy  External genitalia:  Normal appearing vulva with no masses, tenderness, or lesions  BUS/Urethra/Skene's glands:  Normal  Vagina:  Normal appearing with normal color and discharge, no lesions  Cervix:  Normal appearing without  discharge or lesions  Uterus:  Normal in size, shape and contour.  Midline and mobile, nontender  Adnexa/parametria:     Rt: Normal in size, without masses or tenderness.   Lt: Normal in size, without masses or tenderness.  Anus and perineum: Normal  Digital rectal exam: Normal sphincter tone without palpated masses or tenderness  Assessment/Plan:  50 y.o. [id number] for annual exam.   Well female exam with routine gynecological exam - Plan: CBC with Differential/Platelet, Comprehensive metabolic panel. Will plan for early appt next year for lipid panel. Education provided on SBEs, importance of preventative screenings, current guidelines, high calcium diet, regular exercise, and multivitamin daily.   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 Will repeat at 5-year interval per guidelines.  Screening for breast cancer - Normal mammogram history.  Continue annual screenings.  Normal breast exam today.  Screening for colon cancer - Has not had screening colonoscopy. Discussed current guidelines and importance of preventative screenings. Information provided on [facility] and encouraged to schedule this soon.   Return in 1 year for annual.    Olivia Mackie DNP, 1:50 PM 06/01/2021

## 2021-06-01 NOTE — Patient Instructions (Signed)
Schedule Colonoscopy! ?[facility] ?[phone] ?[address] [place], [place] [address] ? ?

## 2021-06-02 LAB — CBC WITH DIFFERENTIAL/PLATELET
Absolute Monocytes: 721 cells/uL (ref 200–950)
Basophils Absolute: 73 cells/uL (ref 0–200)
Basophils Relative: 0.9 %
Eosinophils Absolute: 73 cells/uL (ref 15–500)
Eosinophils Relative: 0.9 %
HCT: 41.4 % (ref 35.0–45.0)
Hemoglobin: 14 g/dL (ref 11.7–15.5)
Lymphs Abs: 2163 cells/uL (ref 850–3900)
MCH: 31.1 pg (ref 27.0–33.0)
MCHC: 33.8 g/dL (ref 32.0–36.0)
MCV: 92 fL (ref 80.0–100.0)
MPV: 9.7 fL (ref 7.5–12.5)
Monocytes Relative: 8.9 %
Neutro Abs: 5071 cells/uL (ref 1500–7800)
Neutrophils Relative %: 62.6 %
Platelets: 363 10*3/uL (ref 140–400)
RBC: 4.5 10*6/uL (ref 3.80–5.10)
RDW: 12.3 % (ref 11.0–15.0)
Total Lymphocyte: 26.7 %
WBC: 8.1 10*3/uL (ref 3.8–10.8)

## 2021-06-02 LAB — COMPREHENSIVE METABOLIC PANEL
AG Ratio: 1.5 (calc) (ref 1.0–2.5)
ALT: 13 U/L (ref 6–29)
AST: 18 U/L (ref 10–35)
Albumin: 4.4 g/dL (ref 3.6–5.1)
Alkaline phosphatase (APISO): 51 U/L (ref 37–153)
BUN: 13 mg/dL (ref 7–25)
CO2: 31 mmol/L (ref 20–32)
Calcium: 9.6 mg/dL (ref 8.6–10.4)
Chloride: 101 mmol/L (ref 98–110)
Creat: 0.87 mg/dL (ref 0.50–1.05)
Globulin: 2.9 g/dL (calc) (ref 1.9–3.7)
Glucose, Bld: 70 mg/dL (ref 65–99)
Potassium: 3.9 mmol/L (ref 3.5–5.3)
Sodium: 138 mmol/L (ref 135–146)
Total Bilirubin: 0.4 mg/dL (ref 0.2–1.2)
Total Protein: 7.3 g/dL (ref 6.1–8.1)

## 2022-01-08 ENCOUNTER — Encounter: Payer: Self-pay | Admitting: Gastroenterology

## 2022-01-21 ENCOUNTER — Other Ambulatory Visit: Payer: Self-pay

## 2022-01-21 ENCOUNTER — Ambulatory Visit (AMBULATORY_SURGERY_CENTER): Payer: No Typology Code available for payment source

## 2022-01-21 VITALS — Ht 64.0 in | Wt 125.0 lb

## 2022-01-21 DIAGNOSIS — Z1211 Encounter for screening for malignant neoplasm of colon: Secondary | ICD-10-CM

## 2022-01-21 MED ORDER — NA SULFATE-K SULFATE-MG SULF 17.5-3.13-1.6 GM/177ML PO SOLN: 1.0000 | Freq: Once | ORAL | 0 refills | Status: AC

## 2022-01-21 NOTE — Progress Notes (Signed)
No egg or soy allergy known to patient  °No issues known to pt with past sedation with any surgeries or procedures °Patient denies ever being told they had issues or difficulty with intubation  °No FH of Malignant Hyperthermia °Pt is not on diet pills °Pt is not on home 02  °Pt is not on blood thinners  °Pt denies issues with constipation  °No A fib or A flutter °Pt is fully vaccinated for Covid x 2; °NO PA's for preps discussed with pt in PV today  °Discussed with pt there will be an out-of-pocket cost for prep and that varies from $0 to 70 + dollars - pt verbalized understanding  °Due to the COVID-19 pandemic we are asking patients to follow certain guidelines in PV and the [facility]   °Pt aware of COVID protocols and [facility] guidelines  °PV completed over the phone. Pt verified name, DOB, address and insurance during PV today.  °Pt mailed instruction packet with copy of consent form to read and not return, and instructions.  °Pt encouraged to call with questions or issues.  °If pt has My chart, procedure instructions sent via My Chart  ° °

## 2022-02-02 ENCOUNTER — Encounter: Payer: Self-pay | Admitting: Gastroenterology

## 2022-02-04 ENCOUNTER — Encounter: Payer: Self-pay | Admitting: Gastroenterology

## 2022-02-04 ENCOUNTER — Ambulatory Visit (AMBULATORY_SURGERY_CENTER): Payer: No Typology Code available for payment source | Admitting: Gastroenterology

## 2022-02-04 VITALS — BP 143/64 | HR 54 | Temp 97.8°F | Resp 8 | Ht 64.0 in | Wt 125.0 lb

## 2022-02-04 DIAGNOSIS — Z1211 Encounter for screening for malignant neoplasm of colon: Secondary | ICD-10-CM

## 2022-02-04 MED ORDER — SODIUM CHLORIDE 0.9 % IV SOLN: 500.0000 mL | Freq: Once | INTRAVENOUS | Status: DC

## 2022-02-04 NOTE — Op Note (Signed)
[facility] Patient Name: Christina Hicks Procedure Date: 02/04/2022 2:48 PM MRN: [id number] Endoscopist: Mauri Pole , MD Age: 51 Referring MD:  Date of Birth: [birth year] Gender: Female Account #: [id number] Procedure:                Colonoscopy Indications:              Screening for colorectal malignant neoplasm Medicines:                Monitored Anesthesia Care Procedure:                Pre-Anesthesia Assessment:                           - Prior to the procedure, a History and Physical                            was performed, and patient medications and                            allergies were reviewed. The patient's tolerance of                            previous anesthesia was also reviewed. The risks                            and benefits of the procedure and the sedation                            options and risks were discussed with the patient.                            All questions were answered, and informed consent                            was obtained. Prior Anticoagulants: The patient has                            taken no previous anticoagulant or antiplatelet                            agents. ASA Grade Assessment: II - A patient with                            mild systemic disease. After reviewing the risks                            and benefits, the patient was deemed in                            satisfactory condition to undergo the procedure.                           After obtaining informed consent, the colonoscope  was passed under direct vision. Throughout the                            procedure, the patient's blood pressure, pulse, and                            oxygen saturations were monitored continuously. The                            Olympus [id number]) Colonoscope was                            introduced through the anus and advanced to the the                            cecum,  identified by appendiceal orifice and                            ileocecal valve. The colonoscopy was performed                            without difficulty. The patient tolerated the                            procedure well. The quality of the bowel                            preparation was good. The ileocecal valve,                            appendiceal orifice, and rectum were photographed. Scope In: 3:10:48 PM Scope Out: 3:21:49 PM Scope Withdrawal Time: 0 hours 5 minutes 56 seconds  Total Procedure Duration: 0 hours 11 minutes 1 second  Findings:                 The perianal and digital rectal examinations were                            normal.                           A few small-mouthed diverticula were found in the                            sigmoid colon.                           Non-bleeding external and internal hemorrhoids were                            found during retroflexion. The hemorrhoids were                            small.  The exam was otherwise without abnormality. Complications:            No immediate complications. Estimated Blood Loss:     Estimated blood loss: none. Impression:               - Diverticulosis in the sigmoid colon.                           - Non-bleeding external and internal hemorrhoids.                           - The examination was otherwise normal.                           - No specimens collected. Recommendation:           - Patient has a contact number available for                            emergencies. The signs and symptoms of potential                            delayed complications were discussed with the                            patient. Return to normal activities tomorrow.                            Written discharge instructions were provided to the                            patient.                           - Resume previous diet.                           - Continue present  medications.                           - Repeat colonoscopy in 10 years for surveillance. Mauri Pole, MD 02/04/2022 3:26:26 PM This report has been signed electronically.

## 2022-02-04 NOTE — Progress Notes (Signed)
PT taken to PACU. Monitors in place. VSS. Report given to RN. 

## 2022-02-04 NOTE — Patient Instructions (Signed)
Read all of the handouts given to you by your recovery room nurse.  YOU HAD AN ENDOSCOPIC PROCEDURE TODAY AT THE [facility]:   Refer to the procedure report that was given to you for any specific questions about what was found during the examination.  If the procedure report does not answer your questions, please call your gastroenterologist to clarify.  If you requested that your care partner not be given the details of your procedure findings, then the procedure report has been included in a sealed envelope for you to review at your convenience later.  YOU SHOULD EXPECT: Some feelings of bloating in the abdomen. Passage of more gas than usual.  Walking can help get rid of the air that was put into your GI tract during the procedure and reduce the bloating. If you had a lower endoscopy (such as a colonoscopy or flexible sigmoidoscopy) you may notice spotting of blood in your stool or on the toilet paper. If you underwent a bowel prep for your procedure, you may not have a normal bowel movement for a few days.  Please Note:  You might notice some irritation and congestion in your nose or some drainage.  This is from the oxygen used during your procedure.  There is no need for concern and it should clear up in a day or so.  SYMPTOMS TO REPORT IMMEDIATELY:  Following lower endoscopy (colonoscopy or flexible sigmoidoscopy):  Excessive amounts of blood in the stool  Significant tenderness or worsening of abdominal pains  Swelling of the abdomen that is new, acute  Fever of 100F or higher   For urgent or emergent issues, a gastroenterologist can be reached at any hour by calling [phone]. Do not use MyChart messaging for urgent concerns.    DIET:  We do recommend a small meal at first, but then you may proceed to your regular diet.  Drink plenty of fluids but you should avoid alcoholic beverages for 24 hours. Try to eat plenty of fiber, and drink plenty of water.  ACTIVITY:   You should plan to take it easy for the rest of today and you should NOT DRIVE or use heavy machinery until tomorrow (because of the sedation medicines used during the test).    FOLLOW UP: Our staff will call the number listed on your records 48-72 hours following your procedure to check on you and address any questions or concerns that you may have regarding the information given to you following your procedure. If we do not reach you, we will leave a message.  We will attempt to reach you two times.  During this call, we will ask if you have developed any symptoms of COVID 19. If you develop any symptoms (ie: fever, flu-like symptoms, shortness of breath, cough etc.) before then, please call [phone].  If you test positive for Covid 19 in the 2 weeks post procedure, please call and report this information to [place].      SIGNATURES/CONFIDENTIALITY: You and/or your care partner have signed paperwork which will be entered into your electronic medical record.  These signatures attest to the fact that that the information above on your After Visit Summary has been reviewed and is understood.  Full responsibility of the confidentiality of this discharge information lies with you and/or your care-partner.

## 2022-02-04 NOTE — Progress Notes (Signed)
[facility] History and Physical   Primary Care Physician:  Pcp, No   Reason for Procedure:  Colorectal cancer screening  Plan:    Screening colonoscopy with possible interventions as needed     HPI: Christina Hicks is a very pleasant 51 y.o. female here for screening colonoscopy. Denies any nausea, vomiting, abdominal pain, melena or bright red blood per rectum  The risks and benefits as well as alternatives of endoscopic procedure(s) have been discussed and reviewed. All questions answered. The patient agrees to proceed.    Past Medical History:  Diagnosis Date   CIN I (cervical intraepithelial neoplasia I)    Deviated septum    GERD (gastroesophageal reflux disease)    on meds   IBS (irritable bowel syndrome)    Migraine    Ovarian cyst    Seasonal allergies     Past Surgical History:  Procedure Laterality Date   NO PAST SURGERIES      Prior to Admission medications   Medication Sig Start Date End Date Taking? Authorizing Provider  acetaminophen (TYLENOL) 500 MG tablet Take 500 mg by mouth every 6 (six) hours as needed.   Yes [provider]  Calcium Carbonate Antacid (TUMS E-X PO) Take 1 tablet by mouth daily as needed.   Yes [provider]  ibuprofen (ADVIL,MOTRIN) 800 MG tablet Take 1 tablet (800 mg total) by mouth every 8 (eight) hours as needed. 12/26/17   Harrington Challenger, NP  nitrofurantoin (MACRODANTIN) 50 MG capsule Take as needed with intercourse Patient not taking: Reported on 02/04/2022 05/29/19   Harrington Challenger, NP  Triamcinolone Acetonide (NASACORT AQ NA) Place 1 application into the nose daily as needed. Patient not taking: Reported on 02/04/2022    [provider]    Current Outpatient Medications  Medication Sig Dispense Refill   acetaminophen (TYLENOL) 500 MG tablet Take 500 mg by mouth every 6 (six) hours as needed.     Calcium Carbonate Antacid (TUMS E-X PO) Take 1 tablet by mouth daily as needed.      ibuprofen (ADVIL,MOTRIN) 800 MG tablet Take 1 tablet (800 mg total) by mouth every 8 (eight) hours as needed. 30 tablet 1   nitrofurantoin (MACRODANTIN) 50 MG capsule Take as needed with intercourse (Patient not taking: Reported on 02/04/2022) 30 capsule 0   Triamcinolone Acetonide (NASACORT AQ NA) Place 1 application into the nose daily as needed. (Patient not taking: Reported on 02/04/2022)     Current Facility-Administered Medications  Medication Dose Route Frequency Provider Last Rate Last Admin   0.9 %  sodium chloride infusion  500 mL Intravenous Once Napoleon Form, MD        Allergies as of 02/04/2022 - Review Complete 02/04/2022  Allergen Reaction Noted   Sulfa antibiotics Rash and Nausea And Vomiting 07/27/2012   Azithromycin Nausea And Vomiting and Rash 07/27/2012    Family History  Problem Relation Age of Onset   Hypertension Mother    Heart disease Father    Breast cancer Neg Hx    Colon polyps Neg Hx    Colon cancer Neg Hx    Esophageal cancer Neg Hx    Stomach cancer Neg Hx    Rectal cancer Neg Hx     Social History   Socioeconomic History   Marital status: Married    Spouse name: Not on file   Number of children: Not on file   Years of education: Not on file   Highest education level: Not  on file  Occupational History   Not on file  Tobacco Use   Smoking status: Never   Smokeless tobacco: Never  Vaping Use   Vaping Use: Never used  Substance and Sexual Activity   Alcohol use: Yes    Alcohol/week: 0.0 standard drinks    Comment: Rare   Drug use: No   Sexual activity: Yes    Birth control/protection: Other-see comments    Comment: VASECTOMY-1st intercourse 51 yo-Fewer than 5 partners  Other Topics Concern   Not on file  Social History Narrative   Not on file   Social Determinants of Health   Financial Resource Strain: Not on file  Food Insecurity: Not on file  Transportation Needs: Not on file  Physical Activity: Not on file  Stress: Not  on file  Social Connections: Not on file  Intimate Partner Violence: Not on file    Review of Systems:  All other review of systems negative except as mentioned in the HPI.  Physical Exam: Vital signs in last 24 hours: BP 139/82    Pulse 63    Temp 97.8 F (36.6 C)    Ht 5\' 4"  (1.626 m)    Wt 125 lb (56.7 kg)    SpO2 99%    BMI 21.46 kg/m  General:   Alert, NAD Lungs:  Clear .   Heart:  Regular rate and rhythm Abdomen:  Soft, nontender and nondistended. Neuro/Psych:  Alert and cooperative. Normal mood and affect. A and O x 3  Reviewed labs, radiology imaging, old records and pertinent past GI work up  Patient is appropriate for planned procedure(s) and anesthesia in an ambulatory setting   K. , MD [phone]

## 2022-02-08 ENCOUNTER — Telehealth: Payer: Self-pay | Admitting: *Deleted

## 2022-02-08 NOTE — Telephone Encounter (Signed)
°  Follow up Call-  Call back number 02/04/2022  Post procedure Call Back phone  # [phone]  Permission to leave phone message Yes  Some recent data might be hidden     Patient questions:  Do you have a fever, pain , or abdominal swelling? No. Pain Score  0 *  Have you tolerated food without any problems? Yes.    Have you been able to return to your normal activities? Yes.    Do you have any questions about your discharge instructions: Diet   No. Medications  No. Follow up visit  No.  Do you have questions or concerns about your Care? No.  Actions: * If pain score is 4 or above: No action needed, pain <4.  Have you developed a fever since your procedure? no  2.   Have you had an respiratory symptoms (SOB or cough) since your procedure? no  3.   Have you tested positive for COVID 19 since your procedure no  4.   Have you had any family members/close contacts diagnosed with the COVID 19 since your procedure?  no   If yes to any of these questions please route to Laverna Peace, RN and Karlton Lemon, RN

## 2022-06-02 ENCOUNTER — Ambulatory Visit: Payer: No Typology Code available for payment source | Admitting: Nurse Practitioner

## 2022-06-07 ENCOUNTER — Ambulatory Visit (INDEPENDENT_AMBULATORY_CARE_PROVIDER_SITE_OTHER): Payer: No Typology Code available for payment source | Admitting: Nurse Practitioner

## 2022-06-07 ENCOUNTER — Encounter: Payer: Self-pay | Admitting: Nurse Practitioner

## 2022-06-07 VITALS — BP 118/74 | Ht 64.0 in | Wt 129.0 lb

## 2022-06-07 DIAGNOSIS — Z01419 Encounter for gynecological examination (general) (routine) without abnormal findings: Secondary | ICD-10-CM | POA: Diagnosis not present

## 2022-06-09 ENCOUNTER — Other Ambulatory Visit: Payer: No Typology Code available for payment source

## 2022-06-09 DIAGNOSIS — Z01419 Encounter for gynecological examination (general) (routine) without abnormal findings: Secondary | ICD-10-CM

## 2022-06-09 LAB — CBC WITH DIFFERENTIAL/PLATELET
Absolute Monocytes: 628 cells/uL (ref 200–950)
Basophils Absolute: 58 cells/uL (ref 0–200)
Basophils Relative: 0.8 %
Eosinophils Absolute: 88 cells/uL (ref 15–500)
Eosinophils Relative: 1.2 %
HCT: 41.2 % (ref 35.0–45.0)
Hemoglobin: 13.8 g/dL (ref 11.7–15.5)
Lymphs Abs: 1759 cells/uL (ref 850–3900)
MCH: 30.4 pg (ref 27.0–33.0)
MCHC: 33.5 g/dL (ref 32.0–36.0)
MCV: 90.7 fL (ref 80.0–100.0)
MPV: 9.8 fL (ref 7.5–12.5)
Monocytes Relative: 8.6 %
Neutro Abs: 4767 cells/uL (ref 1500–7800)
Neutrophils Relative %: 65.3 %
Platelets: 366 10*3/uL (ref 140–400)
RBC: 4.54 10*6/uL (ref 3.80–5.10)
RDW: 12.3 % (ref 11.0–15.0)
Total Lymphocyte: 24.1 %
WBC: 7.3 10*3/uL (ref 3.8–10.8)

## 2022-06-09 LAB — COMPREHENSIVE METABOLIC PANEL
AG Ratio: 1.3 (calc) (ref 1.0–2.5)
ALT: 16 U/L (ref 6–29)
AST: 23 U/L (ref 10–35)
Albumin: 4.2 g/dL (ref 3.6–5.1)
Alkaline phosphatase (APISO): 54 U/L (ref 37–153)
BUN: 13 mg/dL (ref 7–25)
CO2: 26 mmol/L (ref 20–32)
Calcium: 9.3 mg/dL (ref 8.6–10.4)
Chloride: 101 mmol/L (ref 98–110)
Creat: 0.74 mg/dL (ref 0.50–1.03)
Globulin: 3.2 g/dL (calc) (ref 1.9–3.7)
Glucose, Bld: 89 mg/dL (ref 65–99)
Potassium: 4.5 mmol/L (ref 3.5–5.3)
Sodium: 135 mmol/L (ref 135–146)
Total Bilirubin: 0.6 mg/dL (ref 0.2–1.2)
Total Protein: 7.4 g/dL (ref 6.1–8.1)

## 2022-06-09 LAB — LIPID PANEL
Cholesterol: 189 mg/dL (ref <200)
HDL: 72 mg/dL (ref >=50)
LDL Cholesterol (Calc): 102 mg/dL (calc) — ABNORMAL HIGH
Non-HDL Cholesterol (Calc): 117 mg/dL (calc) (ref <130)
Total CHOL/HDL Ratio: 2.6 (calc) (ref <5.0)
Triglycerides: 68 mg/dL (ref <150)

## 2022-07-13 ENCOUNTER — Other Ambulatory Visit: Payer: Self-pay | Admitting: Nurse Practitioner

## 2022-07-13 DIAGNOSIS — Z1231 Encounter for screening mammogram for malignant neoplasm of breast: Secondary | ICD-10-CM

## 2022-07-21 ENCOUNTER — Ambulatory Visit
Admission: RE | Admit: 2022-07-21 | Discharge: 2022-07-21 | Disposition: A | Discharge status: Home / Self Care | Payer: No Typology Code available for payment source | Source: Ambulatory Visit | Attending: Nurse Practitioner | Admitting: Nurse Practitioner

## 2022-07-21 DIAGNOSIS — Z1231 Encounter for screening mammogram for malignant neoplasm of breast: Secondary | ICD-10-CM

## 2023-06-09 ENCOUNTER — Ambulatory Visit: Payer: No Typology Code available for payment source | Admitting: Nurse Practitioner

## 2023-07-26 ENCOUNTER — Other Ambulatory Visit (HOSPITAL_COMMUNITY)
Admission: RE | Admit: 2023-07-26 | Discharge: 2023-07-26 | Disposition: A | Discharge status: Home / Self Care | Payer: BC Managed Care – PPO | Source: Ambulatory Visit | Attending: Nurse Practitioner | Admitting: Nurse Practitioner

## 2023-07-26 ENCOUNTER — Ambulatory Visit (INDEPENDENT_AMBULATORY_CARE_PROVIDER_SITE_OTHER): Payer: BC Managed Care – PPO | Admitting: Nurse Practitioner

## 2023-07-26 ENCOUNTER — Encounter: Payer: Self-pay | Admitting: Nurse Practitioner

## 2023-07-26 VITALS — BP 120/70 | HR 56 | Ht 64.0 in | Wt 127.0 lb

## 2023-07-26 DIAGNOSIS — Z01419 Encounter for gynecological examination (general) (routine) without abnormal findings: Secondary | ICD-10-CM

## 2023-07-26 DIAGNOSIS — Z124 Encounter for screening for malignant neoplasm of cervix: Secondary | ICD-10-CM | POA: Diagnosis not present

## 2023-07-26 DIAGNOSIS — N946 Dysmenorrhea, unspecified: Secondary | ICD-10-CM

## 2023-07-26 DIAGNOSIS — E7800 Pure hypercholesterolemia, unspecified: Secondary | ICD-10-CM | POA: Diagnosis not present

## 2023-07-26 LAB — CBC WITH DIFFERENTIAL/PLATELET
Absolute Monocytes: 619 cells/uL (ref 200–950)
Basophils Absolute: 69 cells/uL (ref 0–200)
Basophils Relative: 0.8 %
Eosinophils Absolute: 103 cells/uL (ref 15–500)
Eosinophils Relative: 1.2 %
HCT: 42.2 % (ref 35.0–45.0)
Hemoglobin: 13.9 g/dL (ref 11.7–15.5)
Lymphs Abs: 2081 cells/uL (ref 850–3900)
MCH: 30.1 pg (ref 27.0–33.0)
MCHC: 32.9 g/dL (ref 32.0–36.0)
MCV: 91.3 fL (ref 80.0–100.0)
MPV: 9.9 fL (ref 7.5–12.5)
Monocytes Relative: 7.2 %
Neutro Abs: 5728 cells/uL (ref 1500–7800)
Neutrophils Relative %: 66.6 %
Platelets: 398 10*3/uL (ref 140–400)
RBC: 4.62 10*6/uL (ref 3.80–5.10)
RDW: 12.5 % (ref 11.0–15.0)
Total Lymphocyte: 24.2 %
WBC: 8.6 10*3/uL (ref 3.8–10.8)

## 2023-07-26 NOTE — Progress Notes (Signed)
Christina Hicks 21-Sep-1971 [id number]   History:  52 y.o. [id number] presents for annual exam without GYN complaints. Monthly cycles. Severe cramping first couple of days of menses, not new. Uses Tylenol or Midol and heat. 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 Normal mammogram history.  Gynecologic History Patient's last menstrual period was 07/11/2023 (exact date). Period Duration (Days): 7 Period Pattern: Regular (usually regular, skipped 1 mth) Menstrual Flow:  (varies but heavy this last cycle) Menstrual Control: Maxi pad, Tampon Dysmenorrhea: (!) Severe Dysmenorrhea Symptoms: Cramping Contraception/Family planning: vasectomy Sexually active: Yes  Health Maintenance Last Pap: 12/26/2017. Results were: Normal neg HPV, 5-year repeat Last mammogram: 07/21/2022. Results were: Normal Last colonoscopy: 02/04/2022. Results were: Normal, 10-year recall Last Dexa: Not indicated  Past medical history, past surgical history, family history and social history were all reviewed and documented in the EPIC chart. Married. Works for [employer]. 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   ROS:  A ROS was performed and pertinent positives and negatives are included.  Exam:  Vitals:   07/26/23 1051  BP: 120/70  Pulse: (!) 56  SpO2: 99%  Weight: 127 lb (57.6 kg)  Height: 5\' 4"  (1.626 m)     Body mass index is 21.8 kg/m.  General appearance:  Normal Thyroid:  Symmetrical, normal in size, without palpable masses or nodularity. Respiratory  Auscultation:  Clear without wheezing or rhonchi Cardiovascular  Auscultation:  Regular rate, without rubs, murmurs or gallops  Edema/varicosities:  Not grossly evident Abdominal  Soft,nontender, without masses, guarding or rebound.  Liver/spleen:  No organomegaly noted  Hernia:  None appreciated  Skin  Inspection:  Grossly normal Breasts: Examined lying and sitting.   Right: Without masses,  retractions, nipple discharge or axillary adenopathy.   Left: Without masses, retractions, nipple discharge or axillary adenopathy. Genitourinary   Inguinal/mons:  Normal without inguinal adenopathy  External genitalia:  Normal appearing vulva with no masses, tenderness, or lesions  BUS/Urethra/Skene's glands:  Normal  Vagina:  Normal appearing with normal color and discharge, no lesions  Cervix:  Normal appearing without discharge or lesions  Uterus:  Normal in size, shape and contour.  Midline and mobile, nontender  Adnexa/parametria:     Rt: Normal in size, without masses or tenderness.   Lt: Normal in size, without masses or tenderness.  Anus and perineum: Normal  Digital rectal exam: Not indicated  Patient informed chaperone available to be present for breast and pelvic exam. Patient has requested no chaperone to be present. Patient has been advised what will be completed during breast and pelvic exam.   Assessment/Plan:  52 y.o. [name] for annual exam.   Well female exam with routine gynecological exam - Plan: CBC with Differential/Platelet, Comprehensive metabolic panel. Education provided on SBEs, importance of preventative screenings, current guidelines, high calcium diet, regular exercise, and multivitamin daily.   Screening for cervical cancer - Plan: Cytology - PAP( [facility]). 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   Elevated LDL cholesterol level - Plan: Lipid panel  Dysmenorrhea - Recommend 600-800 mg Ibuprofen every 8 hours on worst days. May alternate Tylenol.   Screening for breast cancer - Normal mammogram history. Continue annual screenings. Normal breast exam today.  Screening for colon cancer - 01/2022 colonoscopy. Will repeat at 10-year interval per GI recommendation.   Screening for osteoporosis - Average risk. Will plan DXA at age 45.  Return in 1 year for annual.  Olivia Mackie DNP, 11:27 AM 07/26/2023

## 2023-07-28 LAB — CYTOLOGY - PAP

## 2023-08-03 ENCOUNTER — Other Ambulatory Visit: Payer: Self-pay | Admitting: Nurse Practitioner

## 2023-08-03 DIAGNOSIS — Z1231 Encounter for screening mammogram for malignant neoplasm of breast: Secondary | ICD-10-CM

## 2023-08-04 ENCOUNTER — Ambulatory Visit
Admission: RE | Admit: 2023-08-04 | Discharge: 2023-08-04 | Disposition: A | Discharge status: Home / Self Care | Payer: BC Managed Care – PPO | Source: Ambulatory Visit | Attending: Nurse Practitioner | Admitting: Nurse Practitioner

## 2023-08-04 DIAGNOSIS — Z1231 Encounter for screening mammogram for malignant neoplasm of breast: Secondary | ICD-10-CM | POA: Diagnosis not present

## 2024-02-17 DIAGNOSIS — H43313 Vitreous membranes and strands, bilateral: Secondary | ICD-10-CM | POA: Diagnosis not present

## 2024-02-17 DIAGNOSIS — G43809 Other migraine, not intractable, without status migrainosus: Secondary | ICD-10-CM | POA: Diagnosis not present

## 2024-07-24 ENCOUNTER — Other Ambulatory Visit: Payer: Self-pay | Admitting: Nurse Practitioner

## 2024-07-24 DIAGNOSIS — Z1231 Encounter for screening mammogram for malignant neoplasm of breast: Secondary | ICD-10-CM

## 2024-07-30 NOTE — Progress Notes (Unsigned)
   Christina Hicks September 08, 1971 [id number]   History:  53 y.o. [id number] presents for annual exam without GYN complaints. Monthly cycles. Severe cramping first couple of days of menses, not new. Uses Tylenol  or Midol  and heat. 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 Normal mammogram history.  Gynecologic History No LMP recorded.   Contraception/Family planning: vasectomy Sexually active: Yes  Health Maintenance Last Pap: 07/26/2023. Results were: Normal neg HPV Last mammogram: 08/04/2023. Results were: Normal. Scheduled 8/28 Last colonoscopy: 02/04/2022. Results were: Normal, 10-year recall Last Dexa: Not indicated  Past medical history, past surgical history, family history and social history were all reviewed and documented in the EPIC chart. Married. Works for [employer]. 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   ROS:  A ROS was performed and pertinent positives and negatives are included.  Exam:  There were no vitals filed for this visit.    There is no height or weight on file to calculate BMI.  General appearance:  Normal Thyroid:  Symmetrical, normal in size, without palpable masses or nodularity. Respiratory  Auscultation:  Clear without wheezing or rhonchi Cardiovascular  Auscultation:  Regular rate, without rubs, murmurs or gallops  Edema/varicosities:  Not grossly evident Abdominal  Soft,nontender, without masses, guarding or rebound.  Liver/spleen:  No organomegaly noted  Hernia:  None appreciated  Skin  Inspection:  Grossly normal Breasts: Examined lying and sitting.   Right: Without masses, retractions, nipple discharge or axillary adenopathy.   Left: Without masses, retractions, nipple discharge or axillary adenopathy. Pelvic: External genitalia:  no lesions              Urethra:  normal appearing urethra with no masses, tenderness or lesions              Bartholins and Skenes: normal                 Vagina: normal  appearing vagina with normal color and discharge, no lesions              Cervix: no lesions Bimanual Exam:  Uterus:  no masses or tenderness              Adnexa: no mass, fullness, tenderness              Rectovaginal: Deferred              Anus:  normal, no lesions   Assessment/Plan:  53 y.o. [id number] for annual exam.   Well female exam with routine gynecological exam - Plan: CBC with Differential/Platelet, Comprehensive metabolic panel. Education provided on SBEs, importance of preventative screenings, current guidelines, high calcium diet, regular exercise, and multivitamin daily.   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 Will repeat at 5-year interval per guidelines.   Dysmenorrhea - Recommend 600-800 mg Ibuprofen  every 8 hours on worst days. May alternate Tylenol .   Screening for breast cancer - Normal mammogram history. Continue annual screenings. Normal breast exam today.  Screening for colon cancer - 01/2022 colonoscopy. Will repeat at 10-year interval per GI recommendation.   Screening for osteoporosis - Average risk. Will plan DXA at age 22.  No follow-ups on file.      Christina DELENA Shutter DNP, 1:50 PM 07/30/2024

## 2024-07-31 ENCOUNTER — Ambulatory Visit (INDEPENDENT_AMBULATORY_CARE_PROVIDER_SITE_OTHER): Payer: BC Managed Care – PPO | Admitting: Nurse Practitioner

## 2024-07-31 ENCOUNTER — Encounter: Payer: Self-pay | Admitting: Nurse Practitioner

## 2024-07-31 VITALS — BP 118/76 | HR 64 | Ht 64.0 in | Wt 139.0 lb

## 2024-07-31 DIAGNOSIS — Z01419 Encounter for gynecological examination (general) (routine) without abnormal findings: Secondary | ICD-10-CM | POA: Diagnosis not present

## 2024-07-31 DIAGNOSIS — Z13220 Encounter for screening for lipoid disorders: Secondary | ICD-10-CM

## 2024-07-31 DIAGNOSIS — N946 Dysmenorrhea, unspecified: Secondary | ICD-10-CM

## 2024-07-31 DIAGNOSIS — Z1331 Encounter for screening for depression: Secondary | ICD-10-CM

## 2024-08-01 ENCOUNTER — Ambulatory Visit: Payer: Self-pay | Admitting: Nurse Practitioner

## 2024-08-01 LAB — CBC WITH DIFFERENTIAL/PLATELET
Absolute Lymphocytes: 1770 {cells}/uL (ref 850–3900)
Absolute Monocytes: 608 {cells}/uL (ref 200–950)
Basophils Absolute: 60 {cells}/uL (ref 0–200)
Basophils Relative: 0.8 %
Eosinophils Absolute: 68 {cells}/uL (ref 15–500)
Eosinophils Relative: 0.9 %
HCT: 41.4 % (ref 35.0–45.0)
Hemoglobin: 13.4 g/dL (ref 11.7–15.5)
MCH: 30.1 pg (ref 27.0–33.0)
MCHC: 32.4 g/dL (ref 32.0–36.0)
MCV: 93 fL (ref 80.0–100.0)
MPV: 10 fL (ref 7.5–12.5)
Monocytes Relative: 8.1 %
Neutro Abs: 4995 {cells}/uL (ref 1500–7800)
Neutrophils Relative %: 66.6 %
Platelets: 390 Thousand/uL (ref 140–400)
RBC: 4.45 Million/uL (ref 3.80–5.10)
RDW: 12.3 % (ref 11.0–15.0)
Total Lymphocyte: 23.6 %
WBC: 7.5 Thousand/uL (ref 3.8–10.8)

## 2024-08-01 LAB — COMPREHENSIVE METABOLIC PANEL WITH GFR
AG Ratio: 1.7 (calc) (ref 1.0–2.5)
ALT: 17 U/L (ref 6–29)
AST: 20 U/L (ref 10–35)
Albumin: 4.4 g/dL (ref 3.6–5.1)
Alkaline phosphatase (APISO): 47 U/L (ref 37–153)
BUN: 15 mg/dL (ref 7–25)
CO2: 27 mmol/L (ref 20–32)
Calcium: 9.3 mg/dL (ref 8.6–10.4)
Chloride: 103 mmol/L (ref 98–110)
Creat: 0.68 mg/dL (ref 0.50–1.03)
Globulin: 2.6 g/dL (ref 1.9–3.7)
Glucose, Bld: 90 mg/dL (ref 65–99)
Potassium: 4.4 mmol/L (ref 3.5–5.3)
Sodium: 136 mmol/L (ref 135–146)
Total Bilirubin: 0.4 mg/dL (ref 0.2–1.2)
Total Protein: 7 g/dL (ref 6.1–8.1)
eGFR: 104 mL/min/1.73m2 (ref >=60)

## 2024-08-01 LAB — LIPID PANEL
Cholesterol: 173 mg/dL (ref <200)
HDL: 66 mg/dL (ref >=50)
LDL Cholesterol (Calc): 94 mg/dL
Non-HDL Cholesterol (Calc): 107 mg/dL (ref <130)
Total CHOL/HDL Ratio: 2.6 (calc) (ref <5.0)
Triglycerides: 52 mg/dL (ref <150)

## 2024-08-09 ENCOUNTER — Ambulatory Visit
Admission: RE | Admit: 2024-08-09 | Discharge: 2024-08-09 | Disposition: A | Discharge status: Home / Self Care | Source: Ambulatory Visit | Attending: Nurse Practitioner | Admitting: Nurse Practitioner

## 2024-08-09 DIAGNOSIS — Z1231 Encounter for screening mammogram for malignant neoplasm of breast: Secondary | ICD-10-CM
